# Patient Record
Sex: Female | Born: 1970 | Race: White | Hispanic: No | Smoking: Former smoker
Health system: Southern US, Community
[De-identification: ages and names within clinical notes are randomized; demographics above are authoritative.]

## PROBLEM LIST (undated history)

## (undated) DIAGNOSIS — N200 Calculus of kidney: Secondary | ICD-10-CM

## (undated) DIAGNOSIS — K859 Acute pancreatitis without necrosis or infection, unspecified: Secondary | ICD-10-CM

## (undated) DIAGNOSIS — E119 Type 2 diabetes mellitus without complications: Secondary | ICD-10-CM

## (undated) HISTORY — PX: RENAL BIOPSY: SHX156

## (undated) HISTORY — PX: OTHER SURGICAL HISTORY: SHX169

---

## 2009-05-03 ENCOUNTER — Ambulatory Visit: Payer: Self-pay

## 2009-05-24 ENCOUNTER — Encounter: Payer: Self-pay | Admitting: Maternal & Fetal Medicine

## 2010-02-05 IMAGING — US US OB < 14 WKS - NRPT
1 series · 14 of 28 positions shown · non-contrast
Comparison: none

[Series 1: us ob < 14 wks - nrpt · 14 of 47 slices shown]
[im 2/47]
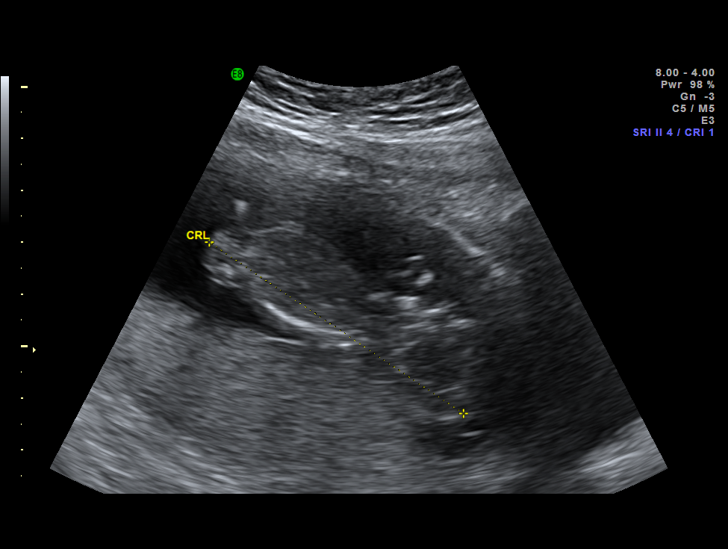
[im 6/47]
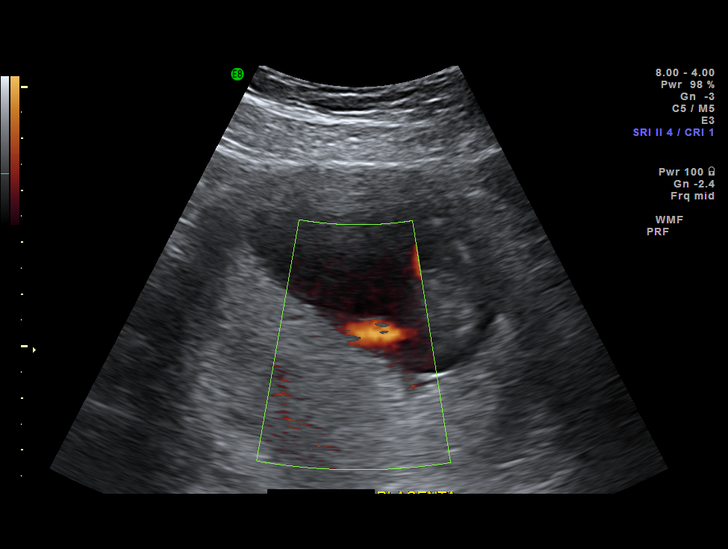
[im 9/47]
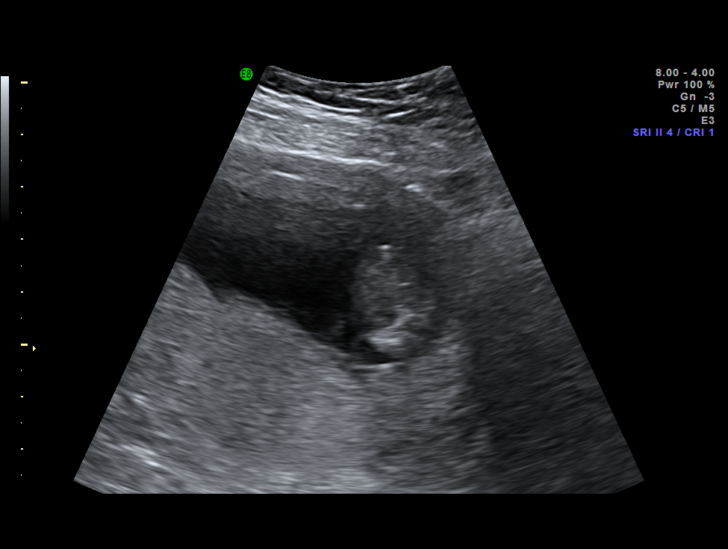
[im 12/47]
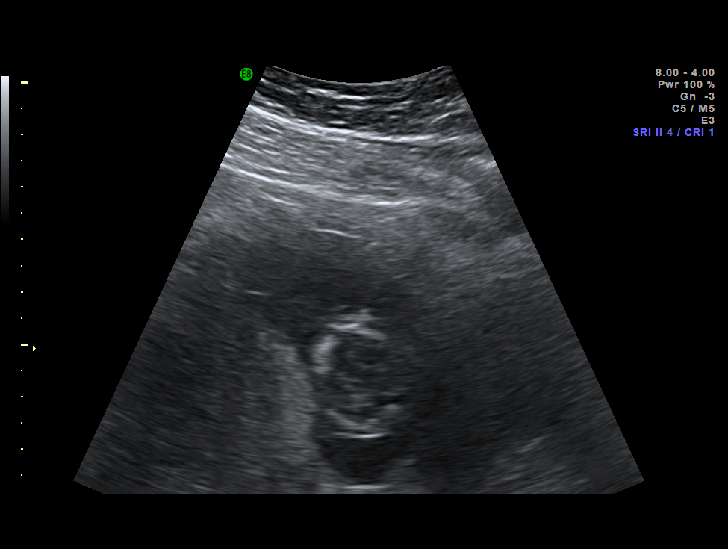
[im 16/47]
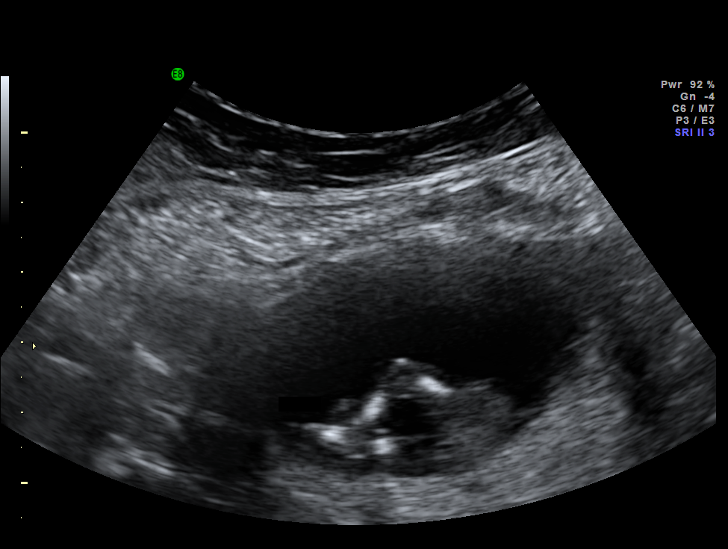
[im 19/47]
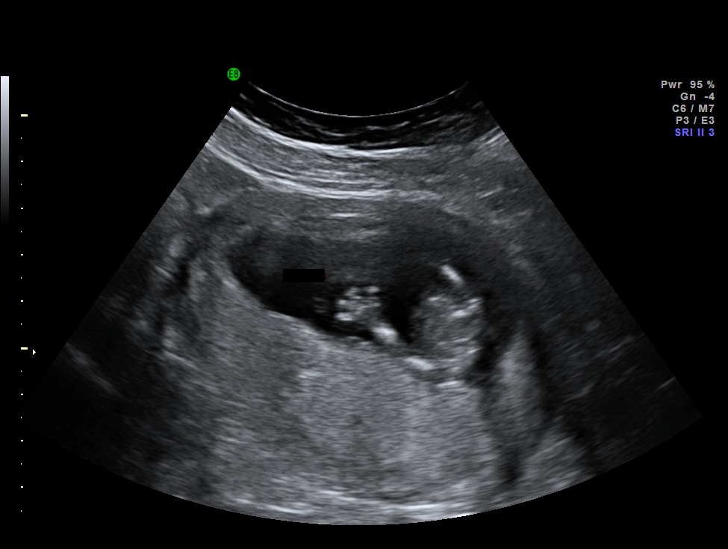
[im 23/47]
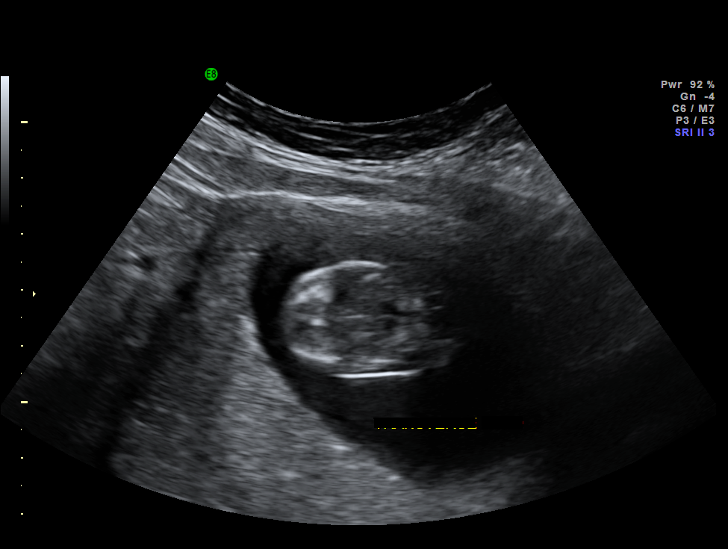
[im 26/47]
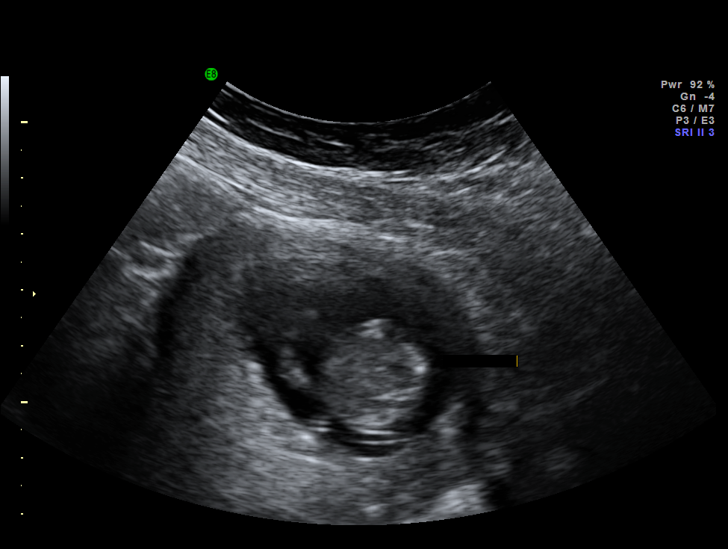
[im 29/47]
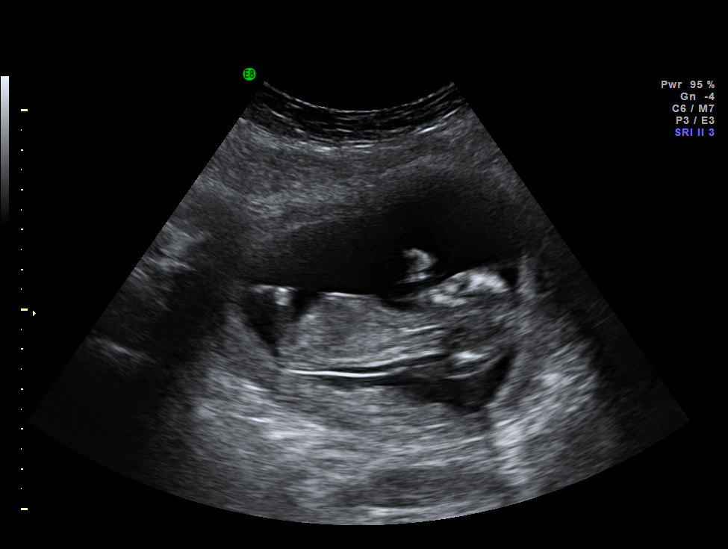
[im 33/47]
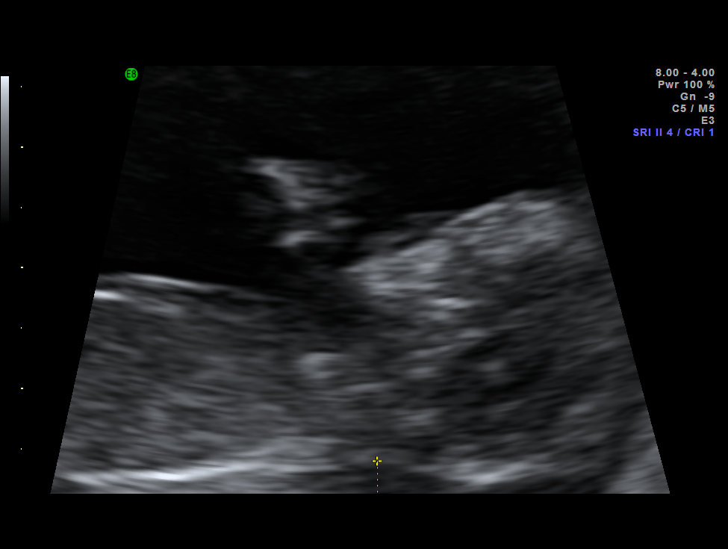
[im 36/47]
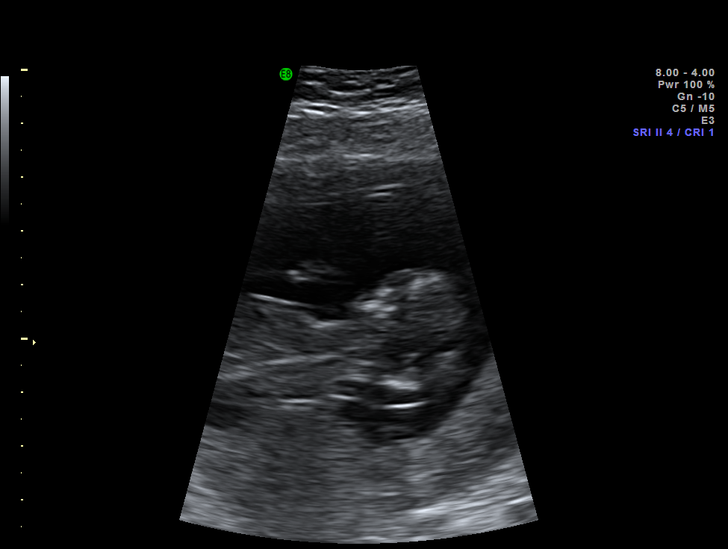
[im 40/47]
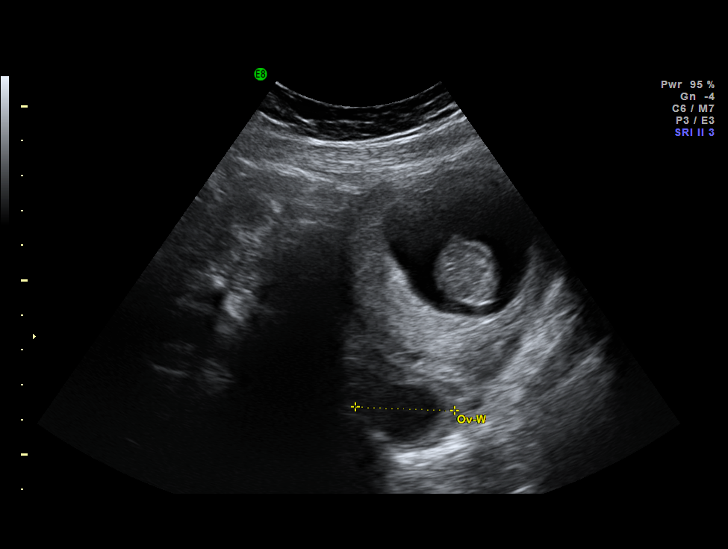
[im 43/47]
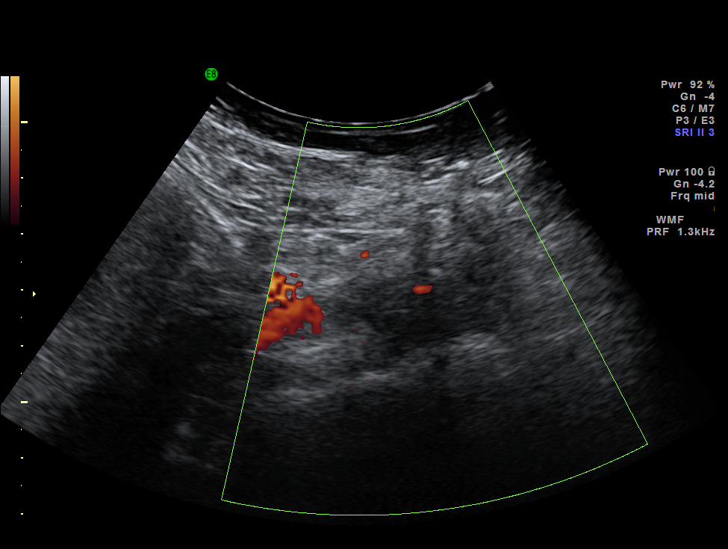
[im 47/47]
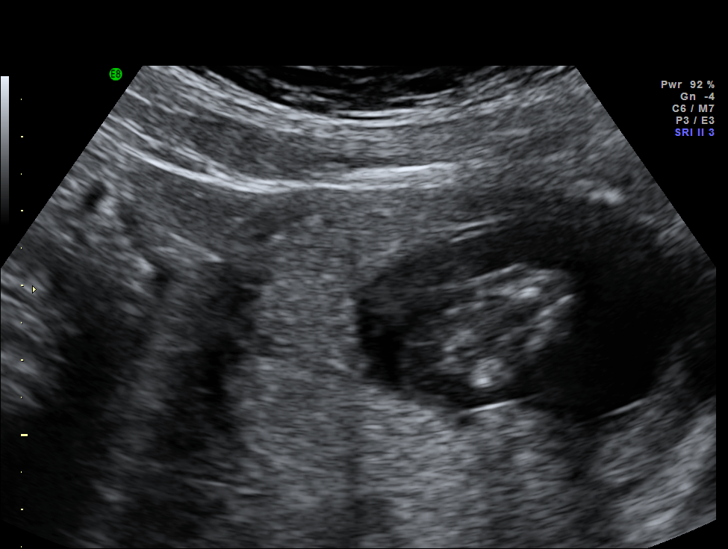

[14 of 28 positions shown; findings below may reference images not displayed]

IMAGES IMPORTED FROM THE [employer]NGO WORKFLOW SYSTEM
NO DICTATION FOR STUDY

## 2010-04-05 ENCOUNTER — Emergency Department: Payer: Self-pay | Admitting: Emergency Medicine

## 2010-04-10 ENCOUNTER — Ambulatory Visit: Payer: Self-pay | Admitting: Pain Medicine

## 2010-04-16 ENCOUNTER — Ambulatory Visit: Payer: Self-pay | Admitting: Pain Medicine

## 2010-07-25 ENCOUNTER — Ambulatory Visit: Payer: Self-pay | Admitting: Pain Medicine

## 2012-11-29 ENCOUNTER — Ambulatory Visit: Payer: Self-pay | Admitting: Urology

## 2013-02-03 ENCOUNTER — Ambulatory Visit: Payer: Self-pay | Admitting: Obstetrics and Gynecology

## 2013-02-03 LAB — HEMOGLOBIN: HGB: 15.1 g/dL (ref 12.0–16.0)

## 2013-02-10 ENCOUNTER — Ambulatory Visit: Payer: Self-pay | Admitting: Obstetrics and Gynecology

## 2014-01-25 ENCOUNTER — Ambulatory Visit: Payer: Self-pay | Admitting: Physician Assistant

## 2014-01-25 LAB — HCG, QUANTITATIVE, PREGNANCY: Beta Hcg, Quant.: <1 m[IU]/mL — ABNORMAL LOW

## 2015-03-09 NOTE — Op Note (Signed)
PATIENT NAME:  Denise [name] MR#:  [id number] DATE OF BIRTH:  1971-04-20  DATE OF PROCEDURE:  02/10/2013  PREOPERATIVE DIAGNOSES:  1.  Chronic pelvic pain.  2.  Right ovarian cyst. 3.  Menorrhagia.   POSTOPERATIVE DIAGNOSES: 1.  Chronic pelvic pain.  2.  Menorrhagia.  3.  Right ovarian cyst, left ovary normal.   OPERATION PERFORMED:  1.  Operative laparoscopy with right ovarian cystectomy. 2.  Hysteroscopy.  3.  Dilatation and curettage and NovaSure endometrial ablation.  ANESTHESIA USED:  General.   SURGEON:  Stoney Bang. Georgianne Fick, MD  ESTIMATED BLOOD LOSS:  Minimal.  OPERATIVE FLUIDS:  800 mL.   COMPLICATIONS:  None.   INTRAOPERATIVE FINDINGS:  Normal intra-abdominal findings with a simple-appearing 4 cm right ovarian cyst. The left ovary was normal. The right ovarian cyst was incised and the cyst wall was removed completely. The hysteroscopy revealed normal endometrial cavity and endometrial findings. The sounding length of the uterus was 8.5 cm, cervical length was determined to be 4.5 cm for a cavity length of 4 cm. The width was determined to be 2.4 cm. The power was 55 with a burn time of 1 minute 10 seconds. Good coverage of the endometrial cavity was noted post-ablation hysteroscopy.   SPECIMENS REMOVED:  Right ovarian cyst wall and endometrial curettings.  PATIENT CONDITION FOLLOWING THIS PROCEDURE:  Stable.   PROCEDURE IN DETAIL:  The risks, benefits, and alternatives of this procedure were discussed with the patient prior to proceeding to the Operating Room. The patient was taken to the Operating Room where she was placed under general endotracheal anesthesia. She was positioned in the dorsal lithotomy position using Allen stirrups. The patient was then prepped and draped in the usual sterile fashion. A time-out was performed. The umbilicus was incised at the base with a stab incision using the 11-blade scalpel. A 5 mm XL trocar was then used to gain entry into the  peritoneal cavity under direct visualization. Pneumoperitoneum was established. The left and right 5 mm assistant ports were then placed under direct visualization. Intraoperative findings wee as discussed above. The right ovarian cyst was incised using a 5 mm Harmonic. The cyst wall was then peeled out using a blunt grasper. The cyst was inspected and noted to be hemostatic. The pneumoperitoneum was evacuated and the ports were removed. Each 5 mm port site was then dressed with Dermabond. Attention was then turned to the patient's pelvis. A sterile speculum was placed. The anterior lip of the cervix was grasped with a single-tooth tenaculum and the cervix was serially dilated using Pratt dilators. Hysteroscopy was then performed noting the above findings. The uterus was sounded and the cervical length determined and these were dilated to the NovaSure device. The NovaSure device revealed a cavity width of 2.4 cm. The cavity assessment completed successfully and the burn time on the NovaSure device was 1 minute 10 seconds. Following the NovaSure ablation, hysteroscopy was once again performed noting good coverage of the endometrial cavity. Of note, a D and C was performed prior to the NovaSure ablation yielding a minimal amount of tissue. The tenaculum and the operative speculum were then removed. the sponge, needle, and instrument counts were correct x 2. The patient tolerated the procedure well and was taken to the Recovery Room in stable.   ____________________________ Stoney Bang. Georgianne Fick, MD ams:jm D: 02/12/2013 11:38:09 ET T: 02/12/2013 13:08:43 ET JOB#: [id number]  cc: Stoney Bang. Georgianne Fick, MD, <Dictator> Dorthula Nettles MD ELECTRONICALLY SIGNED 02/15/2013 13:18

## 2015-10-08 ENCOUNTER — Other Ambulatory Visit: Payer: Self-pay | Admitting: Family Medicine

## 2015-10-08 DIAGNOSIS — J209 Acute bronchitis, unspecified: Secondary | ICD-10-CM

## 2015-10-09 ENCOUNTER — Ambulatory Visit
Admission: RE | Admit: 2015-10-09 | Discharge: 2015-10-09 | Disposition: A | Discharge status: Home / Self Care | Payer: 59 | Source: Ambulatory Visit | Attending: Family Medicine | Admitting: Family Medicine

## 2015-10-09 DIAGNOSIS — J209 Acute bronchitis, unspecified: Secondary | ICD-10-CM | POA: Diagnosis present

## 2015-10-09 DIAGNOSIS — J189 Pneumonia, unspecified organism: Secondary | ICD-10-CM | POA: Diagnosis not present

## 2016-10-17 ENCOUNTER — Other Ambulatory Visit: Payer: Self-pay | Admitting: Family Medicine

## 2016-10-17 DIAGNOSIS — Z1231 Encounter for screening mammogram for malignant neoplasm of breast: Secondary | ICD-10-CM

## 2016-11-19 ENCOUNTER — Other Ambulatory Visit: Payer: Self-pay | Admitting: Surgery

## 2016-11-19 DIAGNOSIS — M25511 Pain in right shoulder: Secondary | ICD-10-CM

## 2016-11-28 ENCOUNTER — Other Ambulatory Visit: Payer: Self-pay | Admitting: Surgery

## 2016-11-28 ENCOUNTER — Ambulatory Visit
Admission: RE | Admit: 2016-11-28 | Discharge: 2016-11-28 | Disposition: A | Discharge status: Home / Self Care | Payer: BLUE CROSS/BLUE SHIELD | Source: Ambulatory Visit | Attending: Family Medicine | Admitting: Family Medicine

## 2016-11-28 DIAGNOSIS — S46911D Strain of unspecified muscle, fascia and tendon at shoulder and upper arm level, right arm, subsequent encounter: Secondary | ICD-10-CM

## 2016-11-28 DIAGNOSIS — M7581 Other shoulder lesions, right shoulder: Secondary | ICD-10-CM

## 2016-11-28 DIAGNOSIS — Z1231 Encounter for screening mammogram for malignant neoplasm of breast: Secondary | ICD-10-CM | POA: Diagnosis present

## 2016-12-04 ENCOUNTER — Ambulatory Visit
Admission: RE | Admit: 2016-12-04 | Discharge: 2016-12-04 | Disposition: A | Discharge status: Home / Self Care | Payer: BLUE CROSS/BLUE SHIELD | Source: Ambulatory Visit | Attending: Surgery | Admitting: Surgery

## 2017-07-01 ENCOUNTER — Other Ambulatory Visit: Payer: Self-pay | Admitting: Internal Medicine

## 2017-07-01 DIAGNOSIS — R1011 Right upper quadrant pain: Secondary | ICD-10-CM

## 2017-07-02 ENCOUNTER — Ambulatory Visit
Admission: RE | Admit: 2017-07-02 | Discharge: 2017-07-02 | Disposition: A | Discharge status: Home / Self Care | Payer: BLUE CROSS/BLUE SHIELD | Source: Ambulatory Visit | Attending: Internal Medicine | Admitting: Internal Medicine

## 2017-07-02 DIAGNOSIS — R1011 Right upper quadrant pain: Secondary | ICD-10-CM | POA: Insufficient documentation

## 2017-07-02 DIAGNOSIS — K769 Liver disease, unspecified: Secondary | ICD-10-CM | POA: Insufficient documentation

## 2017-07-02 DIAGNOSIS — K838 Other specified diseases of biliary tract: Secondary | ICD-10-CM | POA: Diagnosis not present

## 2017-11-02 ENCOUNTER — Encounter: Payer: Self-pay | Admitting: Emergency Medicine

## 2017-11-02 ENCOUNTER — Emergency Department: Payer: BLUE CROSS/BLUE SHIELD

## 2017-11-02 ENCOUNTER — Emergency Department
Admission: EM | Admit: 2017-11-02 | Discharge: 2017-11-02 | Disposition: A | Discharge status: Home / Self Care | Payer: BLUE CROSS/BLUE SHIELD | Attending: Emergency Medicine | Admitting: Emergency Medicine

## 2017-11-02 ENCOUNTER — Other Ambulatory Visit: Payer: Self-pay

## 2017-11-02 DIAGNOSIS — F17210 Nicotine dependence, cigarettes, uncomplicated: Secondary | ICD-10-CM | POA: Diagnosis not present

## 2017-11-02 DIAGNOSIS — R1032 Left lower quadrant pain: Secondary | ICD-10-CM | POA: Diagnosis not present

## 2017-11-02 DIAGNOSIS — K869 Disease of pancreas, unspecified: Secondary | ICD-10-CM | POA: Diagnosis not present

## 2017-11-02 DIAGNOSIS — R112 Nausea with vomiting, unspecified: Secondary | ICD-10-CM

## 2017-11-02 DIAGNOSIS — Z79899 Other long term (current) drug therapy: Secondary | ICD-10-CM | POA: Insufficient documentation

## 2017-11-02 DIAGNOSIS — Q453 Other congenital malformations of pancreas and pancreatic duct: Secondary | ICD-10-CM

## 2017-11-02 DIAGNOSIS — R1013 Epigastric pain: Secondary | ICD-10-CM | POA: Diagnosis present

## 2017-11-02 HISTORY — DX: Acute pancreatitis without necrosis or infection, unspecified: K85.90

## 2017-11-02 HISTORY — DX: Calculus of kidney: N20.0

## 2017-11-02 LAB — COMPREHENSIVE METABOLIC PANEL
ALT: 20 U/L (ref 14–54)
AST: 35 U/L (ref 15–41)
Albumin: 4.4 g/dL (ref 3.5–5.0)
Alkaline Phosphatase: 92 U/L (ref 38–126)
Anion gap: 12 (ref 5–15)
BUN: 9 mg/dL (ref 6–20)
CHLORIDE: 102 mmol/L (ref 101–111)
CO2: 24 mmol/L (ref 22–32)
Calcium: 9.4 mg/dL (ref 8.9–10.3)
Creatinine, Ser: 0.72 mg/dL (ref 0.44–1.00)
Glucose, Bld: 108 mg/dL — ABNORMAL HIGH (ref 65–99)
POTASSIUM: 4 mmol/L (ref 3.5–5.1)
Sodium: 138 mmol/L (ref 135–145)
Total Bilirubin: 0.9 mg/dL (ref 0.3–1.2)
Total Protein: 7.8 g/dL (ref 6.5–8.1)

## 2017-11-02 LAB — CBC
HEMATOCRIT: 45.5 % (ref 35.0–47.0)
HEMOGLOBIN: 15.4 g/dL (ref 12.0–16.0)
MCH: 32.2 pg (ref 26.0–34.0)
MCHC: 33.8 g/dL (ref 32.0–36.0)
MCV: 95.3 fL (ref 80.0–100.0)
Platelets: 355 10*3/uL (ref 150–440)
RBC: 4.77 MIL/uL (ref 3.80–5.20)
RDW: 14.3 % (ref 11.5–14.5)
WBC: 7 10*3/uL (ref 3.6–11.0)

## 2017-11-02 LAB — LIPASE, BLOOD: LIPASE: 17 U/L (ref 11–51)

## 2017-11-02 LAB — TROPONIN I: Troponin I: <0.03 ng/mL (ref <0.03)

## 2017-11-02 MED ORDER — OXYCODONE-ACETAMINOPHEN 5-325 MG PO TABS: 1.0000 | ORAL_TABLET | ORAL | 0 refills | Status: AC | PRN

## 2017-11-02 MED ORDER — GADOBENATE DIMEGLUMINE 529 MG/ML IV SOLN
15.0000 mL | Freq: Once | INTRAVENOUS | Status: AC | PRN
  Administered 2017-11-02: 14 mL via INTRAVENOUS

## 2017-11-02 MED ORDER — ONDANSETRON 4 MG PO TBDP: 4.0000 mg | ORAL_TABLET | Freq: Three times a day (TID) | ORAL | 0 refills | Status: AC | PRN

## 2017-11-02 MED ORDER — IOPAMIDOL (ISOVUE-300) INJECTION 61%
100.0000 mL | Freq: Once | INTRAVENOUS | Status: AC | PRN
  Administered 2017-11-02: 100 mL via INTRAVENOUS

## 2017-11-02 MED ORDER — HYDROMORPHONE HCL 1 MG/ML IJ SOLN
1.0000 mg | Freq: Once | INTRAMUSCULAR | Status: AC
  Administered 2017-11-02: 1 mg via INTRAVENOUS
  Filled 2017-11-02: qty 1

## 2017-11-02 MED ORDER — OXYCODONE-ACETAMINOPHEN 5-325 MG PO TABS
1.0000 | ORAL_TABLET | Freq: Once | ORAL | Status: AC
Start: 2017-11-02 — End: 2017-11-02
  Administered 2017-11-02: 1 via ORAL
  Filled 2017-11-02: qty 1

## 2017-11-02 MED ORDER — ONDANSETRON HCL 4 MG/2ML IJ SOLN
4.0000 mg | Freq: Once | INTRAMUSCULAR | Status: AC
  Administered 2017-11-02: 4 mg via INTRAVENOUS
  Filled 2017-11-02: qty 2

## 2017-11-02 MED ORDER — SODIUM CHLORIDE 0.9 % IV BOLUS (SEPSIS)
1000.0000 mL | Freq: Once | INTRAVENOUS | Status: AC
  Administered 2017-11-02: 1000 mL via INTRAVENOUS

## 2017-11-02 MED ORDER — SENNOSIDES-DOCUSATE SODIUM 8.6-50 MG PO TABS: 2.0000 | ORAL_TABLET | Freq: Every day | ORAL | 1 refills | Status: AC | PRN

## 2017-11-02 NOTE — ED Notes (Signed)
Pt presents today with abdominal pain that started 4 days ago. Pt states she has a GI appointment on the 20th. Pt states she has pain in the lower quad. Pt is A/o x4 and is NAD. Awaiting EDP

## 2017-11-02 NOTE — Discharge Instructions (Signed)
Please take a clear liquid diet for the next 48 hours, then advance to bland diet as tolerated.  You may take Tylenol for mild to moderate pain, and Percocet for severe pain.  Do not drive within 8 hours of taking Percocet.  If you take Percocet, please take the stool softener to prevent constipation.  Zofran is for nausea or vomiting.  Drink plenty of fluids stay well-hydrated.  Please keep your appointment with with the GI physician on Thursday.  Please have your GI doctor follow-up the results of the MRI that you had in the emergency department.   Return to the emergency department if you develop severe pain, inability to keep down fluids, fever, or any other symptoms concerning to you.

## 2017-11-02 NOTE — ED Triage Notes (Signed)
L upper abd pain, nausea and vomiting x 4 days. History of pancreatitis.

## 2017-11-02 NOTE — ED Provider Notes (Signed)
[facility] Emergency Department Provider Note  ____________________________________________  Time seen: Approximately 3:06 PM  I have reviewed the triage vital signs and the nursing notes.   HISTORY  Chief Complaint Abdominal Pain    HPI Denise [name] is a 46 y.o. female with a recent admission to [facility] for pancreatitis presenting with epigastric and left lower quadrant pain, nausea and vomiting.  The patient reports that for the last 3 days, she has been unable to keep anything down.  She has a sharp pain in the left lower quadrant, but has pain in the epigastrium when she pushes on it.  She has had fever to 102.0 at home.  She denies any constipation or diarrhea; last bowel movement was today and it was normal.  No cough or cold symptoms.  Past Medical History:  Diagnosis Date  . Kidney stones   . Pancreatitis     There are no active problems to display for this patient.   Past Surgical History:  Procedure Laterality Date  . RENAL BIOPSY    . uterine ablation      Current Outpatient Rx  . Order #: [id number] Class: Historical Med  . Order #: [id number] Class: Historical Med  . Order #: [id number] Class: Historical Med  . Order #: [id number] Class: Historical Med  . Order #: [id number] Class: Print  . Order #: [id number] Class: Print  . Order #: [id number] Class: Print  . Order #: [id number] Class: Historical Med    Allergies Amoxicillin and Avelox [moxifloxacin]  Family History  Problem Relation Age of Onset  . Prostate cancer Father   . Breast cancer Paternal Aunt        11's  . Prostate cancer Paternal Uncle        4 pat unlces   . Brain cancer Paternal Grandmother     Social History Social History   Tobacco Use  . Smoking status: Current Every Day Smoker    Packs/day: 0.50    Types: Cigarettes  Substance Use Topics  . Alcohol use: Not on file  . Drug use: Not on file    Review of Systems Constitutional: Positive fever to 102.0.  No  myalgias. Eyes: No visual changes. ENT: No sore throat. No congestion or rhinorrhea. Cardiovascular: Denies chest pain. Denies palpitations. Respiratory: Denies shortness of breath.  No cough. Gastrointestinal: Positive epigastric and left lower quadrant abdominal pain.  Positive nausea, positive vomiting.  No diarrhea.  No constipation. Genitourinary: Negative for dysuria. Musculoskeletal: Negative for back pain. Skin: Negative for rash. Neurological: Negative for headaches. No focal numbness, tingling or weakness.     ____________________________________________   PHYSICAL EXAM:  VITAL SIGNS: ED Triage Vitals  Enc Vitals Group     BP 11/02/17 1220 (!) 164/104     Pulse Rate 11/02/17 1220 (!) 120     Resp 11/02/17 1220 20     Temp 11/02/17 1220 99 F (37.2 C)     Temp Source 11/02/17 1220 Oral     SpO2 11/02/17 1220 96 %     Weight 11/02/17 1222 158 lb (71.7 kg)     Height 11/02/17 1222 5\' 3"  (1.6 m)     Head Circumference --      Peak Flow --      Pain Score 11/02/17 1220 9     Pain Loc --      Pain Edu? --      Excl. in [place]? --     Constitutional: Alert and oriented.  Mildly uncomfortable appearing but nontoxic. Answers  questions appropriately. Eyes: Conjunctivae are normal.  EOMI. No scleral icterus. Head: Atraumatic. Nose: No congestion/rhinnorhea. Mouth/Throat: Mucous membranes are moist.  Neck: No stridor.  Supple.  No JVD.  No meningismus. Cardiovascular: Past rate, regular rhythm. No murmurs, rubs or gallops.  Respiratory: Normal respiratory effort.  No accessory muscle use or retractions. Lungs CTAB.  No wheezes, rales or ronchi. Gastrointestinal: Soft, and nondistended.  Tender to palpation in the epigastrium in the left lower quadrant.  Negative Murphy sign.  No guarding or rebound.  No peritoneal signs. Musculoskeletal: No LE edema. Neurologic:  A&Ox3.  Speech is clear.  Face and smile are symmetric.  EOMI.  Moves all extremities well. Skin:  Skin is warm,  dry and intact. No rash noted. Psychiatric: Mood and affect are normal. Speech and behavior are normal.  Normal judgement.  ____________________________________________   LABS (all labs ordered are listed, but only abnormal results are displayed)  Labs Reviewed  COMPREHENSIVE METABOLIC PANEL - Abnormal; Notable for the following components:      Result Value   Glucose, Bld 108 (*)    All other components within normal limits  LIPASE, BLOOD  CBC  TROPONIN I  URINALYSIS, COMPLETE (UACMP) WITH MICROSCOPIC  POC URINE PREG, ED   ____________________________________________  EKG  ED ECG REPORT I, Eula Listen, the attending physician, personally viewed and interpreted this ECG.   Date: 11/02/2017  EKG Time: 1230  Rate: 111  Rhythm: sinus tachycardia  Axis: normal  Intervals:none  ST&T Change: No STEMI  ____________________________________________  RADIOLOGY  Ct Abdomen Pelvis W Contrast  Result Date: 11/02/2017 CLINICAL DATA:  Periumbilical pain for 4 days with nausea and vomiting and also fever. Recent episode of pancreatitis in October 2018. Abdominal distention. EXAM: CT ABDOMEN AND PELVIS WITH CONTRAST TECHNIQUE: Multidetector CT imaging of the abdomen and pelvis was performed using the standard protocol following bolus administration of intravenous contrast. CONTRAST:  132mL ISOVUE-300 IOPAMIDOL (ISOVUE-300) INJECTION 61% COMPARISON:  Overlapping portions of CT chest from 10/09/2015. Report from CT abdomen dated 08/24/2002 FINDINGS: Lower chest: Unremarkable Hepatobiliary: A 2.7 by 2.2 cm hypodense lesion with peripheral nodular enhancement is observed in segment 2 of the liver on image 13/2. Back on 10/09/2015, a hypodense lesion in this vicinity measuring 3.2 by 2.3 cm was present. On the delayed images there is some peripheral fill in of contrast although no complete enhancement of the lesion. On ultrasound a lesion has diffuse echogenicity. The lack of growth over  the last 2 years as well as the hyperechoic appearance and pattern of enhancement all favor benign hepatic hemangioma. The common hepatic duct measures 8 mm in diameter and the common bile duct measures up to 7 mm in diameter. No directly visualized filling defect or specific cause for this mild biliary dilatation is observed. Pancreas: On the prior chest CT the pancreas appeared hypodense and slightly atrophic. On today's CT that remains true except in the pancreatic tail where there is a 4.3 by 2.8 by 2.5 cm region which appears more prominent. This could be from inflammation or neoplasm. Spleen: Unremarkable Adrenals/Urinary Tract: Unremarkable Stomach/Bowel: Unremarkable Vascular/Lymphatic: Aortoiliac atherosclerotic vascular disease. No pathologic adenopathy identified. Reproductive: Unremarkable Other: No supplemental non-categorized findings. Musculoskeletal: 8 mm sclerotic lesion in the L1 vertebral body not appreciably changed from 10/09/2015. Mild lower lumbar spondylosis and degenerative disc disease with borderline impingement at the L4-5 level. IMPRESSION: 1. Chronic atrophic appearance of the pancreas with abnormal expansion and abnormal increased density in the pancreatic tail which could conceivably be  a strange postinflammatory appearance, but could also be neoplastic such as endocrine or exocrine malignancy of the pancreas. This appearance of the pancreatic tail is a significant change from 10/09/2015. Pancreatic protocol MRI with and without contrast is recommended for definitive imaging characterization and to help determine whether this needs biopsy. 2. The lesion in segment 2 of the liver is highly likely to represent a benign hepatic hemangioma based on the echogenic appearance, lack of growth from 2016, and progressive nodular marginal enhancement. 3.  Aortic Atherosclerosis (ICD10-I70.0). Electronically Signed   By: Van Clines M.D.   On: 11/02/2017 16:42     ____________________________________________   PROCEDURES  Procedure(s) performed: None  Procedures  Critical Care performed: No ____________________________________________   INITIAL IMPRESSION / ASSESSMENT AND PLAN / ED COURSE  Pertinent labs & imaging results that were available during my care of the patient were reviewed by me and considered in my medical decision making (see chart for details).  46 y.o. female without any history of abdominal surgery, recent admission for pancreatitis, presenting with left lower quadrant and epigastric pain, associated with nausea and vomiting.  Overall, the patient is mildly tachycardic which may be from some pain and mild dehydration, but afebrile.  However, she has had fevers at home.  I will plan to get a CT scan to evaluate for pancreatitis, gallbladder pathology, and diverticulitis.  The patient will be treated symptomatically, and given intravenous fluids.  Plan reevaluation for final disposition.  I reviewed the patient's medical chart.  ----------------------------------------- 7:07 PM on 11/02/2017 -----------------------------------------  The patient has a negative lipase and a normal white blood cell count without a fever.  Her CT scan shows a chronic atrophic pancreas with abnormal expansion and abnormal increased density in the pancreatic tail.  Given that the patient is clinically stable, her pain has resolved, and she is no longer vomiting, if this pancreatic abnormality can have an outpatient workup.  In order to facilitate her outpatient workup, I have ordered an MRI to be performed in the emergency department and followed up with her GI physician on Thursday.  The patient understands that she will need to obtain the results of the study, and that either her primary care physician or her GI doctor will give her outpatient follow-up recommendations.  She understands return precautions as well as follow-up instructions and will  be discharged after the MRI but does not need to wait for the results today.     ____________________________________________  FINAL CLINICAL IMPRESSION(S) / ED DIAGNOSES  Final diagnoses:  Pancreatic abnormality  Left lower quadrant pain  Non-intractable vomiting with nausea, unspecified vomiting type         NEW MEDICATIONS STARTED DURING THIS VISIT:  This SmartLink is deprecated. Use AVSMEDLIST instead to display the medication list for a patient.    Eula Listen, MD 11/02/17 1910

## 2017-11-05 ENCOUNTER — Other Ambulatory Visit: Payer: Self-pay | Admitting: Internal Medicine

## 2017-11-05 DIAGNOSIS — R1013 Epigastric pain: Secondary | ICD-10-CM

## 2017-11-05 DIAGNOSIS — R112 Nausea with vomiting, unspecified: Secondary | ICD-10-CM

## 2017-11-06 ENCOUNTER — Telehealth: Payer: Self-pay

## 2017-11-06 NOTE — Telephone Encounter (Signed)
After further review of chart, noted a pancreatic tail mass for evaluation. Sent referral to [facility] at patient request for scheduling. They will call her with date/time/instructions. Oncology Nurse Navigator Documentation  Navigator Location: [facility] (11/06/17 1559)   )Navigator Encounter Type: Telephone (11/06/17 1559) Telephone: Outgoing Call (11/06/17 1559)                               Coordination of Care: EUS (11/06/17 1559)                  Time Spent with Patient: 15 (11/06/17 1559)

## 2017-11-06 NOTE — Telephone Encounter (Signed)
Received referral for EUS from Dr. Alice Reichert. Called Denise [name] and offered next available at [facility], 1/24. She would like EUS performed sooner, I will notify Dr. Nestor Lewandowsky office Monday ,they are closed at present. Oncology Nurse Navigator Documentation  Navigator Location: [facility] (11/06/17 1500)   )Navigator Encounter Type: Telephone (11/06/17 1500) Telephone: [name] Call (11/06/17 1500)                       Barriers/Navigation Needs: Coordination of Care (11/06/17 1500)   Interventions: Coordination of Care (11/06/17 1500)   Coordination of Care: EUS (11/06/17 1500)                  Time Spent with Patient: 15 (11/06/17 1500)

## 2017-11-09 ENCOUNTER — Encounter
Admission: RE | Admit: 2017-11-09 | Discharge: 2017-11-09 | Disposition: A | Discharge status: Home / Self Care | Payer: BLUE CROSS/BLUE SHIELD | Source: Ambulatory Visit | Attending: Internal Medicine | Admitting: Internal Medicine

## 2017-11-09 DIAGNOSIS — R112 Nausea with vomiting, unspecified: Secondary | ICD-10-CM | POA: Insufficient documentation

## 2017-11-09 DIAGNOSIS — R1013 Epigastric pain: Secondary | ICD-10-CM | POA: Insufficient documentation

## 2017-11-09 MED ORDER — TECHNETIUM TC 99M MEBROFENIN IV KIT
5.0520 | PACK | Freq: Once | INTRAVENOUS | Status: AC | PRN
  Administered 2017-11-09: 5.052 via INTRAVENOUS

## 2017-12-01 ENCOUNTER — Other Ambulatory Visit: Payer: Self-pay | Admitting: Internal Medicine

## 2017-12-01 DIAGNOSIS — Q453 Other congenital malformations of pancreas and pancreatic duct: Secondary | ICD-10-CM

## 2017-12-01 DIAGNOSIS — R10816 Epigastric abdominal tenderness: Secondary | ICD-10-CM

## 2017-12-11 ENCOUNTER — Ambulatory Visit
Admission: RE | Admit: 2017-12-11 | Discharge: 2017-12-11 | Disposition: A | Discharge status: Home / Self Care | Payer: BLUE CROSS/BLUE SHIELD | Source: Ambulatory Visit | Attending: Internal Medicine | Admitting: Internal Medicine

## 2017-12-11 DIAGNOSIS — R1012 Left upper quadrant pain: Secondary | ICD-10-CM | POA: Diagnosis not present

## 2017-12-11 DIAGNOSIS — K862 Cyst of pancreas: Secondary | ICD-10-CM | POA: Diagnosis not present

## 2017-12-11 DIAGNOSIS — D1803 Hemangioma of intra-abdominal structures: Secondary | ICD-10-CM | POA: Insufficient documentation

## 2017-12-11 DIAGNOSIS — R933 Abnormal findings on diagnostic imaging of other parts of digestive tract: Secondary | ICD-10-CM | POA: Diagnosis not present

## 2017-12-11 DIAGNOSIS — Q453 Other congenital malformations of pancreas and pancreatic duct: Secondary | ICD-10-CM | POA: Insufficient documentation

## 2017-12-11 DIAGNOSIS — R10816 Epigastric abdominal tenderness: Secondary | ICD-10-CM | POA: Insufficient documentation

## 2017-12-11 MED ORDER — IOPAMIDOL (ISOVUE-370) INJECTION 76%
75.0000 mL | Freq: Once | INTRAVENOUS | Status: AC | PRN
Start: 2017-12-11 — End: 2017-12-11
  Administered 2017-12-11: 75 mL via INTRAVENOUS

## 2019-03-06 ENCOUNTER — Emergency Department: Payer: BLUE CROSS/BLUE SHIELD

## 2019-03-06 ENCOUNTER — Encounter: Payer: Self-pay | Admitting: Emergency Medicine

## 2019-03-06 ENCOUNTER — Other Ambulatory Visit: Payer: Self-pay

## 2019-03-06 ENCOUNTER — Emergency Department
Admission: EM | Admit: 2019-03-06 | Discharge: 2019-03-07 | Disposition: A | Discharge status: Home / Self Care | Payer: BLUE CROSS/BLUE SHIELD | Attending: Emergency Medicine | Admitting: Emergency Medicine

## 2019-03-06 DIAGNOSIS — M79601 Pain in right arm: Secondary | ICD-10-CM | POA: Diagnosis not present

## 2019-03-06 DIAGNOSIS — M779 Enthesopathy, unspecified: Secondary | ICD-10-CM | POA: Insufficient documentation

## 2019-03-06 DIAGNOSIS — Z87891 Personal history of nicotine dependence: Secondary | ICD-10-CM | POA: Diagnosis not present

## 2019-03-06 DIAGNOSIS — M7989 Other specified soft tissue disorders: Secondary | ICD-10-CM

## 2019-03-06 NOTE — ED Notes (Signed)
Pt transported to US at this time. 

## 2019-03-06 NOTE — ED Provider Notes (Signed)
[facility] Emergency Department Provider Note   ____________________________________________   First MD Initiated Contact with Patient 03/06/19 2350     (approximate)  I have reviewed the triage vital signs and the nursing notes.   HISTORY  Chief Complaint Arm Pain    HPI Denise [name] is a 48 y.o. female who presents to the ED from home with a chief complaint of nontraumatic right forearm pain and swelling.  Patient reports a 1 week history of right forearm swelling with progressive pain for the past week.  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  She is right-hand dominant.  Recently treated with Z-Pak for bronchitis.  Tested COVID negative.  Otherwise denies fever, chest pain, shortness of breath, abdominal pain, nausea or vomiting.  Denies recent travel or hormone use.       Past Medical History:  Diagnosis Date  . Kidney stones   . Pancreatitis     There are no active problems to display for this patient.   Past Surgical History:  Procedure Laterality Date  . RENAL BIOPSY    . uterine ablation      Prior to Admission medications   Medication Sig Start Date End Date Taking? Authorizing Provider  ADVAIR DISKUS 250-50 MCG/DOSE AEPB Inhale 1 puff into the lungs every 12 (twelve) hours. 10/02/17   [provider]  fluticasone (FLONASE) 50 MCG/ACT nasal spray Place 2 sprays into the nose daily. 12/17/16 12/17/17  [provider]  metoprolol succinate (TOPROL-XL) 25 MG 24 hr tablet Take 1 tablet by mouth daily. 10/22/17   [provider]  naproxen (NAPROSYN) 500 MG tablet Take 1 tablet (500 mg total) by mouth 2 (two) times daily with a meal. 03/07/19   Paulette Blanch, MD  ondansetron (ZOFRAN ODT) 4 MG disintegrating tablet Take 1 tablet (4 mg total) by mouth every 8 (eight) hours as needed for nausea or vomiting. 11/02/17   Eula Listen, MD  oxyCODONE-acetaminophen  (PERCOCET/ROXICET) 5-325 MG tablet Take 1 tablet by mouth every 4 (four) hours as needed for severe pain. 03/07/19   Paulette Blanch, MD  traMADol (ULTRAM) 50 MG tablet Take 1 tablet by mouth every 8 (eight) hours as needed. 09/10/17   [provider]  zolpidem (AMBIEN) 10 MG tablet Take 1 tablet by mouth daily. 10/22/17   [provider]    Allergies Penicillins; Amoxicillin; and Avelox [moxifloxacin]  Family History  Problem Relation Age of Onset  . Prostate cancer Father   . Breast cancer Paternal Aunt        16's  . Prostate cancer Paternal Uncle        4 pat unlces   . Brain cancer Paternal Grandmother     Social History Social History   Tobacco Use  . Smoking status: Former Smoker    Packs/day: 0.50    Types: Cigarettes  . Smokeless tobacco: Never Used  Substance Use Topics  . Alcohol use: Not on file  . Drug use: Not on file    Review of Systems  Constitutional: No fever/chills Eyes: No visual changes. ENT: No sore throat. Cardiovascular: Denies chest pain. Respiratory: Denies shortness of breath. Gastrointestinal: No abdominal pain.  No nausea, no vomiting.  No diarrhea.  No constipation. Genitourinary: Negative for dysuria. Musculoskeletal: Positive for right forearm pain and swelling.  Negative for back pain. Skin: Negative for rash. Neurological: Negative for headaches, focal weakness or numbness.   ____________________________________________   PHYSICAL  EXAM:  VITAL SIGNS: ED Triage Vitals  Enc Vitals Group     BP 03/06/19 2225 (!) 161/81     Pulse Rate 03/06/19 2225 (!) 109     Resp 03/06/19 2225 18     Temp 03/06/19 2225 97.7 F (36.5 C)     Temp Source 03/06/19 2225 Oral     SpO2 03/06/19 2225 98 %     Weight 03/06/19 2226 170 lb (77.1 kg)     Height 03/06/19 2226 5\' 2"  (1.575 m)     Head Circumference --      Peak Flow --      Pain Score 03/06/19 2225 6     Pain Loc --      Pain Edu? --      Excl. in [place]? --      Constitutional: Alert and oriented. Well appearing and in no acute distress. Eyes: Conjunctivae are normal. PERRL. EOMI. Head: Atraumatic. Nose: No congestion/rhinnorhea. Mouth/Throat: Mucous membranes are moist.  Oropharynx non-erythematous. Neck: No stridor.   Cardiovascular: Normal rate, regular rhythm. Grossly normal heart sounds.  Good peripheral circulation. Respiratory: Normal respiratory effort.  No retractions. Lungs CTAB. Gastrointestinal: Soft and nontender. No distention. No abdominal bruits. No CVA tenderness. Musculoskeletal:  RUE: Right forearm tender to palpation.  Measures 26.5 cm compared to the left at 26 cm.  Right upper arm tender to palpation.  Measures 33 cm compared to left at 33.5 cm.  Symmetrically warm limbs.  2+ radial pulse.  Brisk, less than 5-second capillary refill. No lower extremity tenderness nor edema.  No joint effusions. Neurologic:  Normal speech and language. No gross focal neurologic deficits are appreciated. No gait instability. Skin:  Skin is warm, dry and intact. No rash noted. Psychiatric: Mood and affect are normal. Speech and behavior are normal.  ____________________________________________   LABS (all labs ordered are listed, but only abnormal results are displayed)  Labs Reviewed  CBC WITH DIFFERENTIAL/PLATELET - Abnormal; Notable for the following components:      Result Value   WBC 10.8 (*)    Platelets 610 (*)    Eosinophils Absolute 0.8 (*)    Basophils Absolute 0.2 (*)    All other components within normal limits  BASIC METABOLIC PANEL - Abnormal; Notable for the following components:   Glucose, Bld 113 (*)    All other components within normal limits  CK   ____________________________________________  EKG  None ____________________________________________  RADIOLOGY  ED MD interpretation: No DVT  Official radiology report(s): US Venous Img Upper Uni Right  Result Date: 03/07/2019 CLINICAL DATA:  Right upper  extremity swelling EXAM: RIGHT UPPER EXTREMITY VENOUS DOPPLER ULTRASOUND TECHNIQUE: Gray-scale sonography with graded compression, as well as color Doppler and duplex ultrasound were performed to evaluate the upper extremity deep venous system from the level of the subclavian vein and including the jugular, axillary, basilic, radial, ulnar and upper cephalic vein. Spectral Doppler was utilized to evaluate flow at rest and with distal augmentation maneuvers. COMPARISON:  None. FINDINGS: Contralateral Subclavian Vein: Respiratory phasicity is normal and symmetric with the symptomatic side. No evidence of thrombus. Normal compressibility. Internal Jugular Vein: No evidence of thrombus. Normal compressibility, respiratory phasicity and response to augmentation. Subclavian Vein: No evidence of thrombus. Normal compressibility, respiratory phasicity and response to augmentation. Axillary Vein: No evidence of thrombus. Normal compressibility, respiratory phasicity and response to augmentation. Cephalic Vein: No evidence of thrombus. Normal compressibility, respiratory phasicity and response to augmentation. Basilic Vein: No evidence of thrombus. Normal compressibility,  respiratory phasicity and response to augmentation. Brachial Veins: No evidence of thrombus. Normal compressibility, respiratory phasicity and response to augmentation. Radial Veins: No evidence of thrombus. Normal compressibility, respiratory phasicity and response to augmentation. Ulnar Veins: No evidence of thrombus. Normal compressibility, respiratory phasicity and response to augmentation. Venous Reflux:  None visualized. Other Findings:  None visualized. IMPRESSION: No evidence of DVT within the right upper extremity. Electronically Signed   By: Rolm Baptise M.D.   On: 03/07/2019 00:20    ____________________________________________   PROCEDURES  Procedure(s) performed (including Critical Care):  Procedures    ____________________________________________   INITIAL IMPRESSION / ASSESSMENT AND PLAN / ED COURSE  As part of my medical decision making, I reviewed the following data within the [place] notes reviewed and incorporated, Labs reviewed, Old chart reviewed and Notes from prior ED visits        48 year old female who presents with nontraumatic right forearm and upper arm pain.  Differential diagnosis includes but is not limited to musculoskeletal, inflammatory, infectious, DVT, etc.  Denise [name] was evaluated in Emergency Department on 03/07/2019 for the symptoms described in the history of present illness. She was evaluated in the context of the global COVID-19 pandemic, which necessitated consideration that the patient might be at risk for infection with the SARS-CoV-2 virus that causes COVID-19. Institutional protocols and algorithms that pertain to the evaluation of patients at risk for COVID-19 are in a state of rapid change based on information released by regulatory bodies including the CDC and federal and state organizations. These policies and algorithms were followed during the patient's care in the ED.  Awaiting results of ultrasound.  Will check basic lab work including CK.  Administer IV Toradol and oral Percocet and reassess.   Clinical Course as of Mar 06 158  Mon Mar 07, 2019  0155 Feeling better.  Updated her on all test results.  Will place in sling, prescription for NSAIDs, analgesia and work note.  Patient will follow-up with orthopedics as needed.  Strict return precautions given.  Patient verbalizes understanding agrees with plan of care.   [JS]    Clinical Course User Index [JS] Paulette Blanch, MD     ____________________________________________   FINAL CLINICAL IMPRESSION(S) / ED DIAGNOSES  Final diagnoses:  Right arm pain  Tendonitis     ED Discharge Orders         Ordered    naproxen (NAPROSYN) 500 MG tablet  2 times  daily with meals     03/07/19 0157    oxyCODONE-acetaminophen (PERCOCET/ROXICET) 5-325 MG tablet  Every 4 hours PRN     03/07/19 0157           Note:  This document was prepared using Dragon voice recognition software and may include unintentional dictation errors.   Paulette Blanch, MD 03/07/19 [phone]

## 2019-03-06 NOTE — ED Triage Notes (Signed)
Patient with complaint of right arm swelling times one week. Patient denies any injury. Patient with positive radial pulse.

## 2019-03-06 NOTE — ED Notes (Signed)
Pt to US.

## 2019-03-07 LAB — BASIC METABOLIC PANEL
Anion gap: 10 (ref 5–15)
BUN: 14 mg/dL (ref 6–20)
CO2: 24 mmol/L (ref 22–32)
Calcium: 9.3 mg/dL (ref 8.9–10.3)
Chloride: 106 mmol/L (ref 98–111)
Creatinine, Ser: 0.6 mg/dL (ref 0.44–1.00)
GFR calc Af Amer: >60 mL/min (ref >60)
GFR calc non Af Amer: >60 mL/min (ref >60)
Glucose, Bld: 113 mg/dL — ABNORMAL HIGH (ref 70–99)
Potassium: 3.7 mmol/L (ref 3.5–5.1)
Sodium: 140 mmol/L (ref 135–145)

## 2019-03-07 LAB — CBC WITH DIFFERENTIAL/PLATELET
Abs Immature Granulocytes: 0.02 10*3/uL (ref 0.00–0.07)
Basophils Absolute: 0.2 10*3/uL — ABNORMAL HIGH (ref 0.0–0.1)
Basophils Relative: 2 %
Eosinophils Absolute: 0.8 10*3/uL — ABNORMAL HIGH (ref 0.0–0.5)
Eosinophils Relative: 8 %
HCT: 39.1 % (ref 36.0–46.0)
Hemoglobin: 13.1 g/dL (ref 12.0–15.0)
Immature Granulocytes: 0 %
Lymphocytes Relative: 33 %
Lymphs Abs: 3.6 10*3/uL (ref 0.7–4.0)
MCH: 31.5 pg (ref 26.0–34.0)
MCHC: 33.5 g/dL (ref 30.0–36.0)
MCV: 94 fL (ref 80.0–100.0)
Monocytes Absolute: 0.9 10*3/uL (ref 0.1–1.0)
Monocytes Relative: 9 %
Neutro Abs: 5.3 10*3/uL (ref 1.7–7.7)
Neutrophils Relative %: 48 %
Platelets: 610 10*3/uL — ABNORMAL HIGH (ref 150–400)
RBC: 4.16 MIL/uL (ref 3.87–5.11)
RDW: 13.6 % (ref 11.5–15.5)
WBC: 10.8 10*3/uL — ABNORMAL HIGH (ref 4.0–10.5)
nRBC: 0 % (ref 0.0–0.2)

## 2019-03-07 LAB — CK: Total CK: 99 U/L (ref 38–234)

## 2019-03-07 MED ORDER — OXYCODONE-ACETAMINOPHEN 5-325 MG PO TABS
1.0000 | ORAL_TABLET | Freq: Once | ORAL | Status: AC
  Administered 2019-03-07: 01:00:00 1 via ORAL
  Filled 2019-03-07: qty 1

## 2019-03-07 MED ORDER — NAPROXEN 500 MG PO TABS: 500.0000 mg | ORAL_TABLET | Freq: Two times a day (BID) | ORAL | 0 refills | Status: AC

## 2019-03-07 MED ORDER — OXYCODONE-ACETAMINOPHEN 5-325 MG PO TABS: 1.0000 | ORAL_TABLET | ORAL | 0 refills | Status: AC | PRN

## 2019-03-07 MED ORDER — KETOROLAC TROMETHAMINE 30 MG/ML IJ SOLN
15.0000 mg | Freq: Once | INTRAMUSCULAR | Status: AC
  Administered 2019-03-07: 01:00:00 15 mg via INTRAVENOUS
  Filled 2019-03-07: qty 1

## 2019-03-07 NOTE — Discharge Instructions (Addendum)
1.  Take pain medicines as needed (Naprosyn/Percocet). 2.  Wear sling as needed for comfort. 3.  Apply ice to affected area several times daily. 4.  Return to the ER for worsening symptoms, persistent vomiting, difficulty breathing or other concerns.

## 2019-03-07 NOTE — ED Notes (Signed)
Pt back from [place]: Dr Beather Arbour straight in to see pt

## 2019-03-07 NOTE — ED Notes (Signed)
Pt reports right arm pain with some swelling x 1 week; denies injury; says swelling started on top of right forearm and since has also moved to bottom forearm area; denies joint pain to arm; tenderness on palpation to forearm and bicep area; no redness or warmth; full ROM; pain is a constant aching pain and pt says she just can't get comfortable; denies shortness of breath; no history of DVT

## 2019-06-15 ENCOUNTER — Other Ambulatory Visit: Payer: Self-pay | Admitting: Family Medicine

## 2019-06-15 DIAGNOSIS — Z1231 Encounter for screening mammogram for malignant neoplasm of breast: Secondary | ICD-10-CM

## 2020-03-26 ENCOUNTER — Ambulatory Visit
Admission: RE | Admit: 2020-03-26 | Discharge: 2020-03-26 | Disposition: A | Discharge status: Home / Self Care | Payer: BC Managed Care – PPO | Source: Ambulatory Visit | Attending: Family Medicine | Admitting: Family Medicine

## 2020-03-26 DIAGNOSIS — Z1231 Encounter for screening mammogram for malignant neoplasm of breast: Secondary | ICD-10-CM | POA: Diagnosis not present

## 2021-07-25 ENCOUNTER — Other Ambulatory Visit: Payer: Self-pay | Admitting: Family Medicine

## 2021-07-25 DIAGNOSIS — Z1231 Encounter for screening mammogram for malignant neoplasm of breast: Secondary | ICD-10-CM

## 2021-08-07 ENCOUNTER — Emergency Department
Admission: EM | Admit: 2021-08-07 | Discharge: 2021-08-07 | Disposition: A | Discharge status: Home / Self Care | Payer: BC Managed Care – PPO | Attending: Emergency Medicine | Admitting: Emergency Medicine

## 2021-08-07 ENCOUNTER — Encounter: Payer: Self-pay | Admitting: Emergency Medicine

## 2021-08-07 ENCOUNTER — Other Ambulatory Visit: Payer: Self-pay

## 2021-08-07 DIAGNOSIS — K529 Noninfective gastroenteritis and colitis, unspecified: Secondary | ICD-10-CM

## 2021-08-07 DIAGNOSIS — Z87891 Personal history of nicotine dependence: Secondary | ICD-10-CM | POA: Diagnosis not present

## 2021-08-07 DIAGNOSIS — R112 Nausea with vomiting, unspecified: Secondary | ICD-10-CM | POA: Diagnosis not present

## 2021-08-07 DIAGNOSIS — R6883 Chills (without fever): Secondary | ICD-10-CM | POA: Insufficient documentation

## 2021-08-07 DIAGNOSIS — R109 Unspecified abdominal pain: Secondary | ICD-10-CM | POA: Insufficient documentation

## 2021-08-07 DIAGNOSIS — R61 Generalized hyperhidrosis: Secondary | ICD-10-CM | POA: Insufficient documentation

## 2021-08-07 DIAGNOSIS — E119 Type 2 diabetes mellitus without complications: Secondary | ICD-10-CM | POA: Insufficient documentation

## 2021-08-07 DIAGNOSIS — R197 Diarrhea, unspecified: Secondary | ICD-10-CM | POA: Diagnosis not present

## 2021-08-07 HISTORY — DX: Type 2 diabetes mellitus without complications: E11.9

## 2021-08-07 LAB — CBC
HCT: 42.1 % (ref 36.0–46.0)
Hemoglobin: 14.7 g/dL (ref 12.0–15.0)
MCH: 31.9 pg (ref 26.0–34.0)
MCHC: 34.9 g/dL (ref 30.0–36.0)
MCV: 91.3 fL (ref 80.0–100.0)
Platelets: 466 10*3/uL — ABNORMAL HIGH (ref 150–400)
RBC: 4.61 MIL/uL (ref 3.87–5.11)
RDW: 13.7 % (ref 11.5–15.5)
WBC: 7.2 10*3/uL (ref 4.0–10.5)
nRBC: 0 % (ref 0.0–0.2)

## 2021-08-07 LAB — URINALYSIS, COMPLETE (UACMP) WITH MICROSCOPIC
Glucose, UA: NEGATIVE mg/dL
Hgb urine dipstick: NEGATIVE
Ketones, ur: 20 mg/dL — AB
Leukocytes,Ua: NEGATIVE
Nitrite: NEGATIVE
Protein, ur: 30 mg/dL — AB
Specific Gravity, Urine: >1.046 — ABNORMAL HIGH (ref 1.005–1.030)
pH: 5 (ref 5.0–8.0)

## 2021-08-07 LAB — COMPREHENSIVE METABOLIC PANEL
ALT: 36 U/L (ref 0–44)
AST: 27 U/L (ref 15–41)
Albumin: 4.7 g/dL (ref 3.5–5.0)
Alkaline Phosphatase: 100 U/L (ref 38–126)
Anion gap: 12 (ref 5–15)
BUN: 17 mg/dL (ref 6–20)
CO2: 25 mmol/L (ref 22–32)
Calcium: 9.9 mg/dL (ref 8.9–10.3)
Chloride: 99 mmol/L (ref 98–111)
Creatinine, Ser: 0.82 mg/dL (ref 0.44–1.00)
GFR, Estimated: >60 mL/min (ref >60)
Glucose, Bld: 94 mg/dL (ref 70–99)
Potassium: 4 mmol/L (ref 3.5–5.1)
Sodium: 136 mmol/L (ref 135–145)
Total Bilirubin: 1 mg/dL (ref 0.3–1.2)
Total Protein: 7.8 g/dL (ref 6.5–8.1)

## 2021-08-07 LAB — LIPASE, BLOOD: Lipase: 20 U/L (ref 11–51)

## 2021-08-07 MED ORDER — ONDANSETRON 4 MG PO TBDP: 4.0000 mg | ORAL_TABLET | Freq: Three times a day (TID) | ORAL | 0 refills | Status: AC | PRN

## 2021-08-07 MED ORDER — ONDANSETRON HCL 4 MG/2ML IJ SOLN
4.0000 mg | Freq: Once | INTRAMUSCULAR | Status: AC
  Administered 2021-08-07: 4 mg via INTRAVENOUS
  Filled 2021-08-07: qty 2

## 2021-08-07 MED ORDER — SODIUM CHLORIDE 0.9 % IV BOLUS
1000.0000 mL | Freq: Once | INTRAVENOUS | Status: AC
  Administered 2021-08-07: 1000 mL via INTRAVENOUS

## 2021-08-07 NOTE — ED Triage Notes (Signed)
Pt comes into the ED via POV c/o emesis and diarrhea x 4 days.  Pt is diabetic and unable to keep meds and food down at this time.  PT does admit to having pneumonia 2 weeks ago and was on antibiotics, but those are completed.  Pt denies coming in contact with anyone with COVID that she is aware of.  Pt currently has even and unlabored respirations.

## 2021-08-07 NOTE — ED Provider Notes (Signed)
[facility]  ____________________________________________   Event Date/Time   First MD Initiated Contact with Patient 08/07/21 1616     (approximate)  I have reviewed the triage vital signs and the nursing notes.   HISTORY  Chief Complaint Emesis and Diarrhea    HPI Denise [name] is a 50 y.o. female past medical history of diabetes, status with splenectomy. Symptoms started 4 days ago. She has had nausea and vomiting, inability to tolerate PO. She also describes chills and night sweats. Pt endorses some abdominal cramping, but persistent abdominal pain. Diarrhea started last PM.  She is having about 1 episode of diarrhea per day when she is not eating.  More frequently when she takes p.o.  No blood in her stool.  She did have a pneumonia about 2 weeks ago and had antibiotics for this but that is now resolved.  Denies any ongoing cough or shortness of breath.         Past Medical History:  Diagnosis Date  . Diabetes mellitus without complication ([place])   . Kidney stones   . Pancreatitis     There are no problems to display for this patient.   Past Surgical History:  Procedure Laterality Date  . RENAL BIOPSY    . uterine ablation      Prior to Admission medications   Medication Sig Start Date End Date Taking? Authorizing Provider  ADVAIR DISKUS 250-50 MCG/DOSE AEPB Inhale 1 puff into the lungs every 12 (twelve) hours. 10/02/17   [provider]  fluticasone (FLONASE) 50 MCG/ACT nasal spray Place 2 sprays into the nose daily. 12/17/16 12/17/17  [provider]  metoprolol succinate (TOPROL-XL) 25 MG 24 hr tablet Take 1 tablet by mouth daily. 10/22/17   [provider]  naproxen (NAPROSYN) 500 MG tablet Take 1 tablet (500 mg total) by mouth 2 (two) times daily with a meal. 03/07/19   Paulette Blanch, MD  ondansetron (ZOFRAN ODT) 4 MG disintegrating tablet Take 1 tablet (4 mg total) by mouth every 8 (eight) hours as needed for  nausea or vomiting. 11/02/17   Eula Listen, MD  oxyCODONE-acetaminophen (PERCOCET/ROXICET) 5-325 MG tablet Take 1 tablet by mouth every 4 (four) hours as needed for severe pain. 03/07/19   Paulette Blanch, MD  traMADol (ULTRAM) 50 MG tablet Take 1 tablet by mouth every 8 (eight) hours as needed. 09/10/17   [provider]  zolpidem (AMBIEN) 10 MG tablet Take 1 tablet by mouth daily. 10/22/17   [provider]    Allergies Penicillins, Amoxicillin, and Avelox [moxifloxacin]  Family History  Problem Relation Age of Onset  . Prostate cancer Father   . Breast cancer Paternal Aunt        2's  . Prostate cancer Paternal Uncle        4 pat unlces   . Brain cancer Paternal Grandmother     Social History Social History   Tobacco Use  . Smoking status: Former    Packs/day: 0.50    Types: Cigarettes  . Smokeless tobacco: Never  Vaping Use  . Vaping Use: Every day  Substance Use Topics  . Alcohol use: Yes    Comment: rarely    Review of Systems   Review of Systems  Constitutional:  Positive for appetite change. Negative for chills and fever.  Respiratory:  Negative for shortness of breath.   Gastrointestinal:  Positive for diarrhea, nausea and vomiting. Negative for abdominal pain and blood in stool.  Genitourinary:  Negative for dysuria.  Neurological:  Positive for headaches.  All other systems reviewed and are negative.  Physical Exam Updated Vital Signs BP (!) 155/86 (BP Location: Left Arm)   Pulse 95   Temp 99 F (37.2 C) (Oral)   Resp 17   Ht 5\' 2"  (1.575 m)   Wt 77.1 kg   SpO2 99%   BMI 31.09 kg/m   Physical Exam Vitals and nursing note reviewed.  Constitutional:      General: She is not in acute distress.    Appearance: Normal appearance.  HENT:     Head: Normocephalic and atraumatic.  Eyes:     General: No scleral icterus.    Conjunctiva/sclera: Conjunctivae normal.  Pulmonary:     Effort: Pulmonary effort is normal. No  respiratory distress.     Breath sounds: No stridor.  Abdominal:     General: Abdomen is flat. There is no distension.     Palpations: Abdomen is soft.     Tenderness: There is no abdominal tenderness. There is no guarding.  Musculoskeletal:        General: No deformity or signs of injury.     Cervical back: Normal range of motion.  Skin:    General: Skin is dry.     Coloration: Skin is not jaundiced or pale.  Neurological:     General: No focal deficit present.     Mental Status: She is alert and oriented to person, place, and time. Mental status is at baseline.  Psychiatric:        Mood and Affect: Mood normal.        Behavior: Behavior normal.     LABS (all labs ordered are listed, but only abnormal results are displayed)  Labs Reviewed  CBC - Abnormal; Notable for the following components:      Result Value   Platelets 466 (*)    All other components within normal limits  URINALYSIS, COMPLETE (UACMP) WITH MICROSCOPIC - Abnormal; Notable for the following components:   Color, Urine AMBER (*)    APPearance CLOUDY (*)    Specific Gravity, Urine >1.046 (*)    Bilirubin Urine SMALL (*)    Ketones, ur 20 (*)    Protein, ur 30 (*)    Bacteria, UA FEW (*)    All other components within normal limits  LIPASE, BLOOD  COMPREHENSIVE METABOLIC PANEL   ____________________________________________  EKG  N/a ____________________________________________  RADIOLOGY Almeta Monas, personally viewed and evaluated these images (plain radiographs) as part of my medical decision making, as well as reviewing the written report by the radiologist.  ED MD interpretation:  n/a    ____________________________________________   PROCEDURES  Procedure(s) performed (including Critical Care):  Procedures   ____________________________________________   INITIAL IMPRESSION / ASSESSMENT AND PLAN / ED COURSE     50 year old female presents with nausea vomiting diarrhea.   She has an occasional abdominal cramping but no significant pain.  Vital signs within normal limits.  Labs obtained from triage are reassuring, she has no leukocytosis, AKI or electrolyte abnormality.  UA with some ketones but no obvious infection.  She was given fluids and Zofran.  On my assessment she appears well.  Her abdomen is benign.  Suspect noncomplicated viral gastroenteritis.  Will p.o. challenge and discharge with Zofran.  We discussed return precautions.      ____________________________________________   FINAL CLINICAL IMPRESSION(S) / ED DIAGNOSES  Final diagnoses:  None     ED Discharge  Orders     None        Note:  This document was prepared using Dragon voice recognition software and may include unintentional dictation errors.    Rada Hay, MD 08/08/21 [phone]

## 2021-08-07 NOTE — ED Provider Notes (Signed)
Emergency Medicine Provider Triage Evaluation Note  Denise [name] , a 50 y.o. female  was evaluated in triage.  Pt complains of nausea, vomiting, and diarrhea. Type 2 diabetic. Pneumonia 2 weeks ago. Finished antibiotics. No fever.  Review of Systems  Positive: Abdominal pain, nausea, vomiting Negative: Fever  Physical Exam  There were no vitals taken for this visit. Gen:   Awake, no distress   Resp:  Normal effort  MSK:   Moves extremities without difficulty  Other:    Medical Decision Making  Medically screening exam initiated at 2:24 PM.  Appropriate orders placed.  [name] was informed that the remainder of the evaluation will be completed by another provider, this initial triage assessment does not replace that evaluation, and the importance of remaining in the ED until their evaluation is complete.    Victorino Dike, FNP 08/07/21 1428    Lucrezia Starch, MD 08/07/21 1459

## 2022-03-06 ENCOUNTER — Other Ambulatory Visit: Payer: Self-pay | Admitting: Family Medicine

## 2022-03-06 ENCOUNTER — Ambulatory Visit
Admission: RE | Admit: 2022-03-06 | Discharge: 2022-03-06 | Disposition: A | Discharge status: Home / Self Care | Payer: BC Managed Care – PPO | Source: Ambulatory Visit | Attending: Family Medicine | Admitting: Family Medicine

## 2022-03-06 DIAGNOSIS — R112 Nausea with vomiting, unspecified: Secondary | ICD-10-CM | POA: Diagnosis present

## 2022-03-06 DIAGNOSIS — R1013 Epigastric pain: Secondary | ICD-10-CM

## 2022-03-06 DIAGNOSIS — Z8719 Personal history of other diseases of the digestive system: Secondary | ICD-10-CM

## 2022-03-06 DIAGNOSIS — R748 Abnormal levels of other serum enzymes: Secondary | ICD-10-CM

## 2022-03-06 MED ORDER — IOHEXOL 300 MG/ML  SOLN
100.0000 mL | Freq: Once | INTRAMUSCULAR | Status: AC | PRN
  Administered 2022-03-06: 100 mL via INTRAVENOUS

## 2022-11-18 IMAGING — CT CT ABD-PELV W/ CM
2 of 5 series · 16 of 46 positions shown, 18 images · IV contrast (APPLIED)
Comparison: Abdomen CT on 12/11/2017

CLINICAL DATA: Left abdominal and epigastric pain. Nausea and
vomiting. Personal history of pancreatitis.

EXAM:
CT ABDOMEN AND PELVIS WITH CONTRAST
TECHNIQUE: Multidetector CT imaging of the abdomen and pelvis was performed
using the standard protocol following bolus administration of
intravenous contrast.

[Series 2: routine abd/pel with · axial · 0.80mm/px · z∈[-698,-273]mm · 13 of 96 slices shown, 15 images]
[im 6/96  soft-tissue]
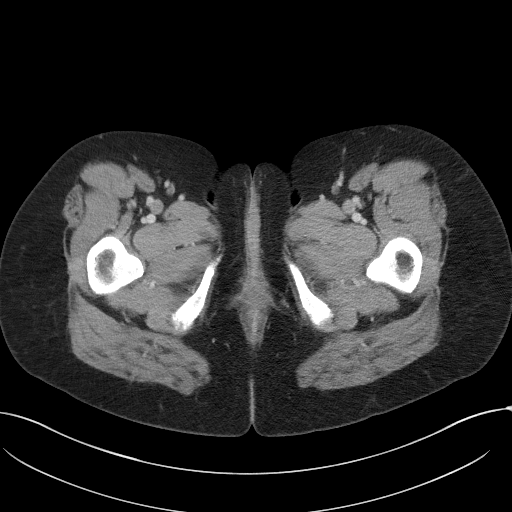
[im 6/96  bone]
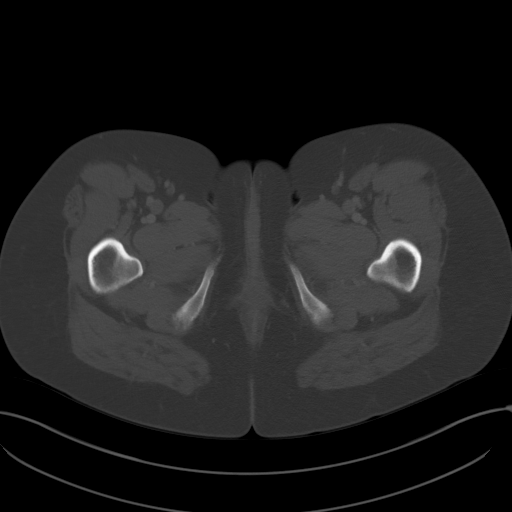
[im 16/96  soft-tissue]
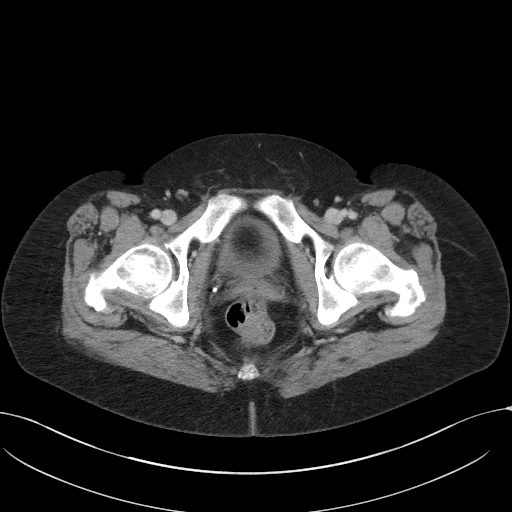
[im 21/96  soft-tissue]
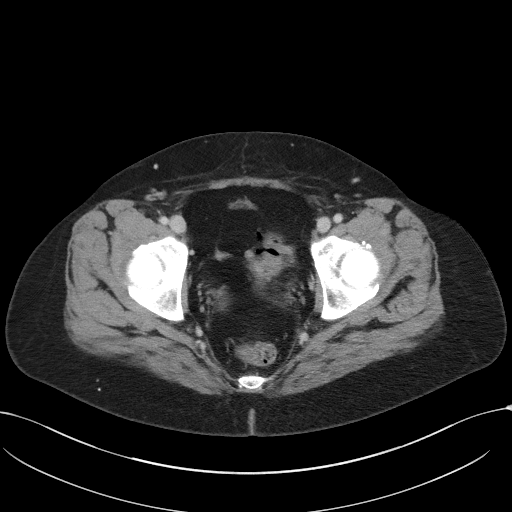
[im 26/96  soft-tissue]
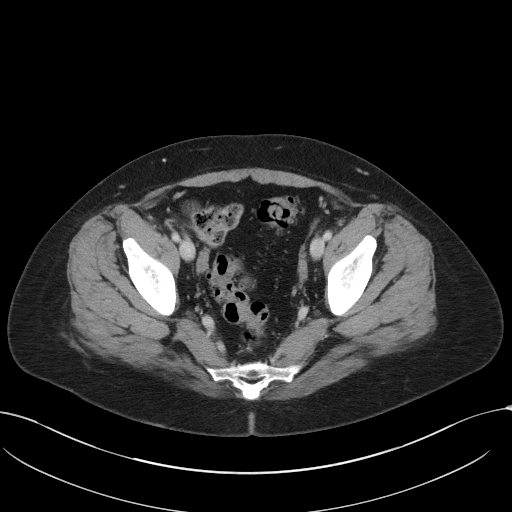
[im 36/96  soft-tissue]
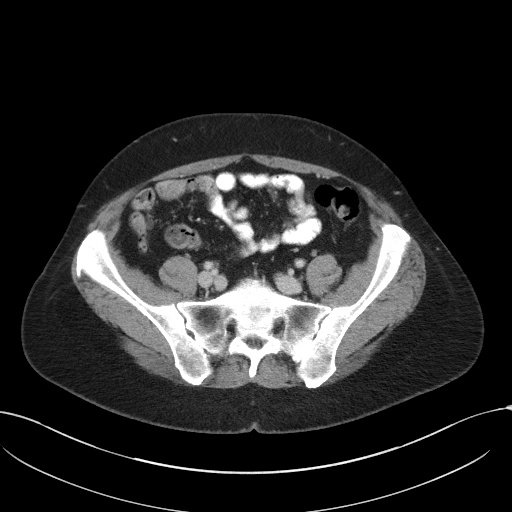
[im 41/96  soft-tissue]
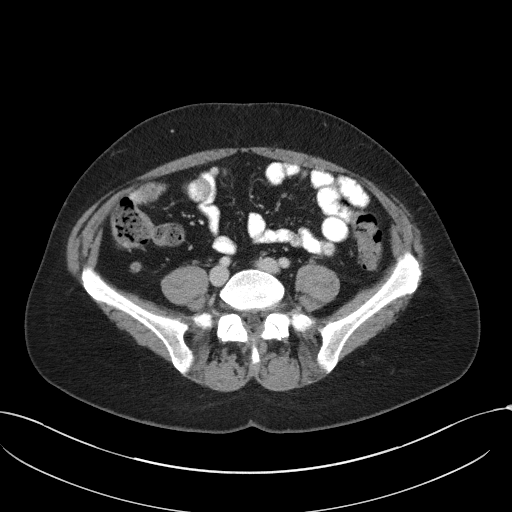
[im 51/96  soft-tissue]
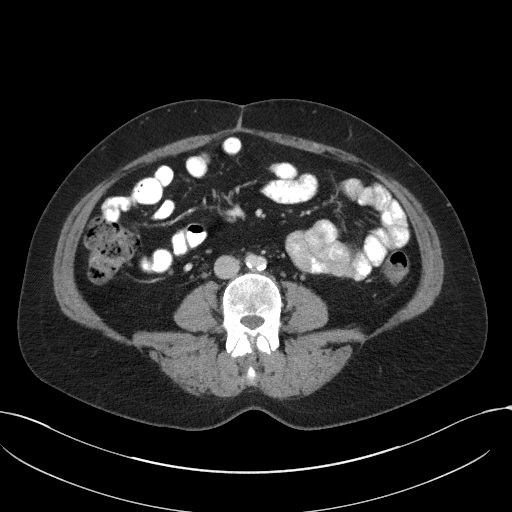
[im 56/96  soft-tissue]
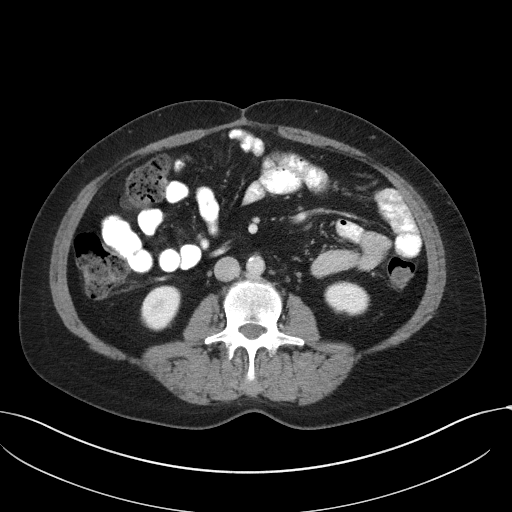
[im 61/96  soft-tissue]
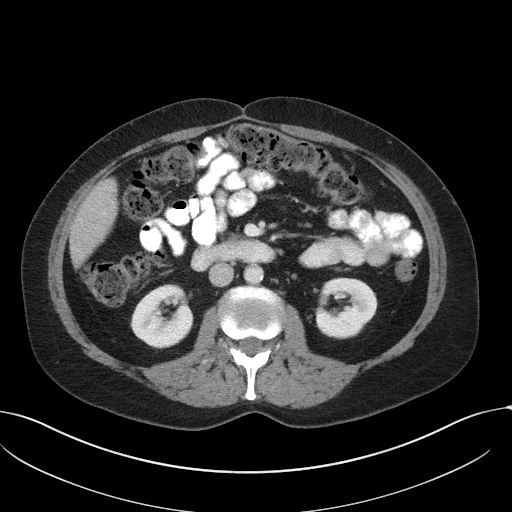
[im 61/96  bone]
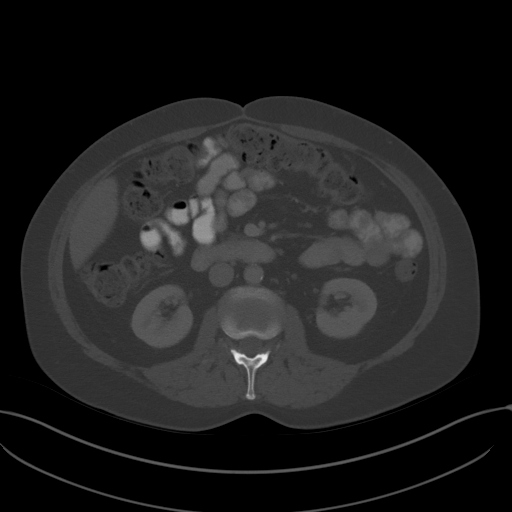
[im 71/96  soft-tissue]
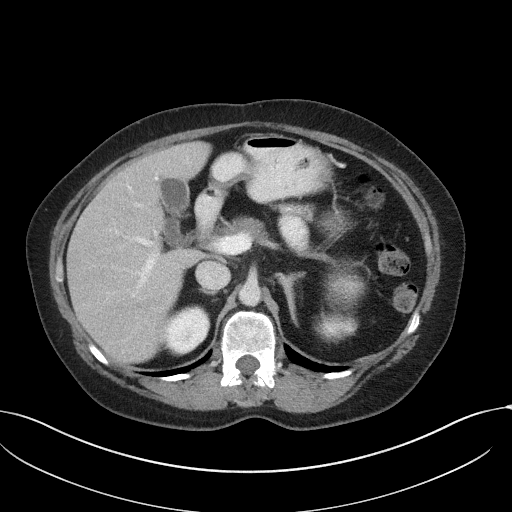
[im 76/96  soft-tissue]
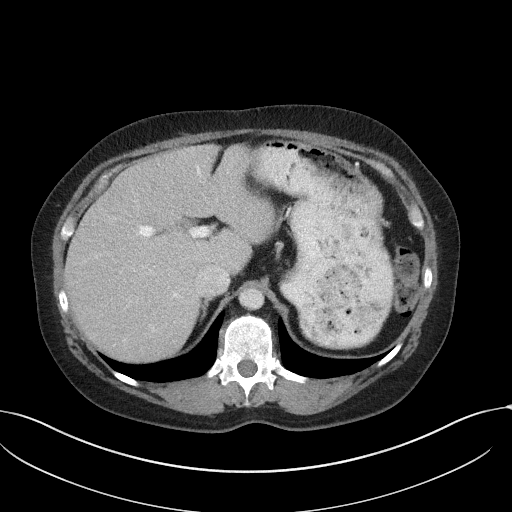
[im 81/96  soft-tissue]
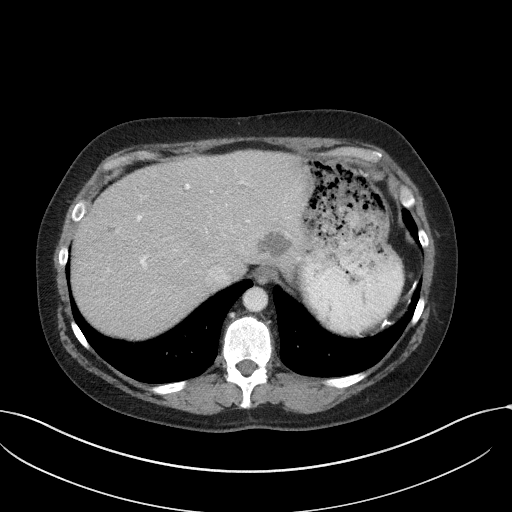
[im 91/96  soft-tissue]
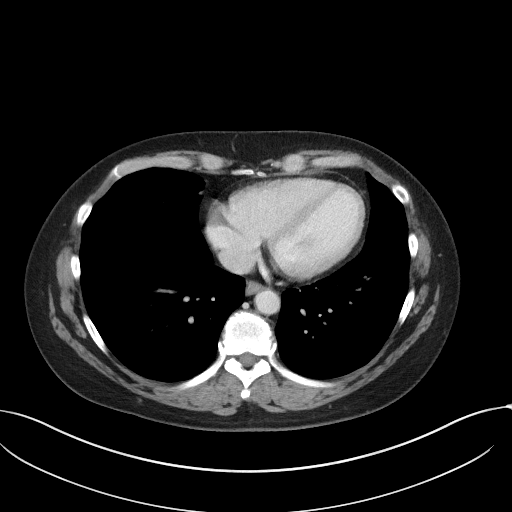

[Series 5: coronal st · coronal · 0.74mm/px · 3 of 93 slices shown]
[im 31/93  soft-tissue]
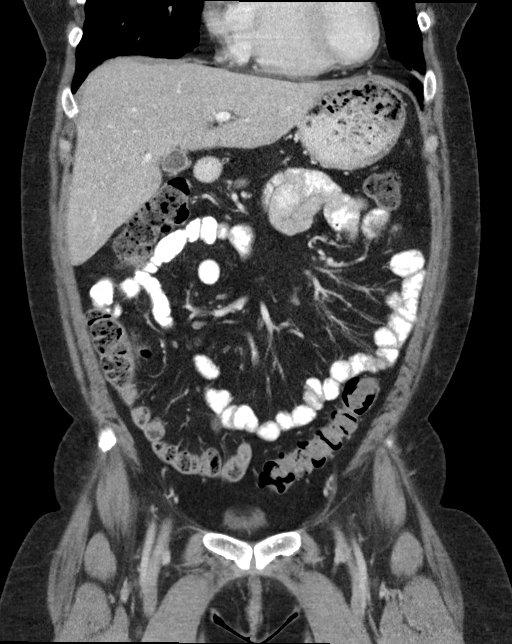
[im 41/93  soft-tissue]
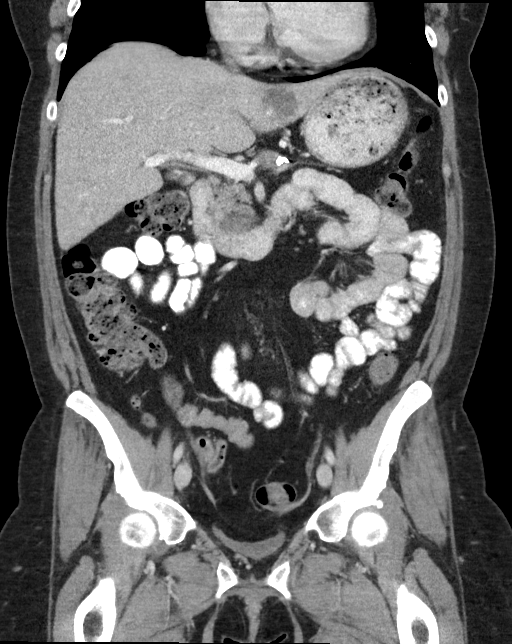
[im 52/93  soft-tissue]
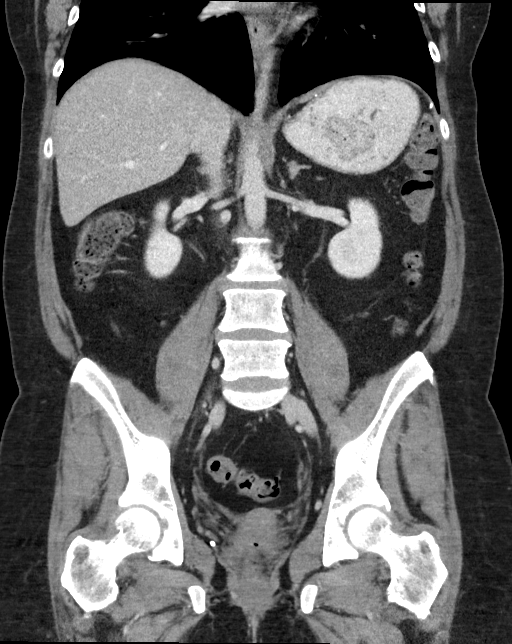

[16 of 46 positions shown; findings below may reference images not displayed]

RADIATION DOSE REDUCTION: This exam was performed according to the
departmental dose-optimization program which includes automated
exposure control, adjustment of the mA and/or kV according to
patient size and/or use of iterative reconstruction technique.

CONTRAST:  100mL OMNIPAQUE IOHEXOL 300 MG/ML  SOLN
FINDINGS: Lower Chest: No acute findings.

Hepatobiliary: A 3 cm benign hemangioma is again seen in the
posterior left hepatic lobe. 8 mm benign hemangioma is seen in the
medial dome of the right hepatic lobe. A sub-cm low-attenuation
lesion in the anterior segment of the right hepatic lobe is too
small to characterize, but most likely represents a tiny benign cyst
or hemangioma. Gallbladder is unremarkable. No evidence of biliary
ductal dilatation.

Pancreas: Patient has undergone distal pancreatectomy since prior
study. The remaining portion of the pancreatic head and body is
normal in appearance. No evidence of mass or inflammatory changes.

Spleen: Surgically absent.

Adrenals/Urinary Tract: No masses identified. No evidence of
ureteral calculi or hydronephrosis.

Stomach/Bowel: No evidence of obstruction, inflammatory process or
abnormal fluid collections. Normal appendix visualized.

Vascular/Lymphatic: No pathologically enlarged lymph nodes. No acute
vascular findings. Aortic atherosclerotic calcification noted.

Reproductive:  No mass or other significant abnormality.

Other:  None.

Musculoskeletal:  No suspicious bone lesions identified.
IMPRESSION: Interval distal pancreatectomy and splenectomy. No radiographic
evidence of acute pancreatitis or other acute findings.

Stable benign hepatic hemangiomas.

Aortic Atherosclerosis ([id number]-KOI.I).

## 2023-02-12 ENCOUNTER — Encounter: Payer: Self-pay | Admitting: *Deleted

## 2023-02-12 ENCOUNTER — Emergency Department
Admission: EM | Admit: 2023-02-12 | Discharge: 2023-02-13 | Disposition: A | Discharge status: Home / Self Care | Payer: Self-pay | Attending: Emergency Medicine | Admitting: Emergency Medicine

## 2023-02-12 ENCOUNTER — Other Ambulatory Visit: Payer: Self-pay

## 2023-02-12 DIAGNOSIS — R519 Headache, unspecified: Secondary | ICD-10-CM | POA: Insufficient documentation

## 2023-02-12 DIAGNOSIS — R718 Other abnormality of red blood cells: Secondary | ICD-10-CM | POA: Insufficient documentation

## 2023-02-12 DIAGNOSIS — R1084 Generalized abdominal pain: Secondary | ICD-10-CM | POA: Insufficient documentation

## 2023-02-12 DIAGNOSIS — E119 Type 2 diabetes mellitus without complications: Secondary | ICD-10-CM | POA: Insufficient documentation

## 2023-02-12 DIAGNOSIS — R197 Diarrhea, unspecified: Secondary | ICD-10-CM | POA: Insufficient documentation

## 2023-02-12 DIAGNOSIS — R112 Nausea with vomiting, unspecified: Secondary | ICD-10-CM | POA: Insufficient documentation

## 2023-02-12 DIAGNOSIS — Z20822 Contact with and (suspected) exposure to covid-19: Secondary | ICD-10-CM | POA: Insufficient documentation

## 2023-02-12 LAB — CBC
HCT: 50.6 % — ABNORMAL HIGH (ref 36.0–46.0)
Hemoglobin: 16.7 g/dL — ABNORMAL HIGH (ref 12.0–15.0)
MCH: 28.3 pg (ref 26.0–34.0)
MCHC: 33 g/dL (ref 30.0–36.0)
MCV: 85.8 fL (ref 80.0–100.0)
Platelets: 392 10*3/uL (ref 150–400)
RBC: 5.9 MIL/uL — ABNORMAL HIGH (ref 3.87–5.11)
RDW: 14.6 % (ref 11.5–15.5)
WBC: 9.3 10*3/uL (ref 4.0–10.5)
nRBC: 0 % (ref 0.0–0.2)

## 2023-02-12 LAB — URINALYSIS, ROUTINE W REFLEX MICROSCOPIC
Bilirubin Urine: NEGATIVE
Glucose, UA: NEGATIVE mg/dL
Hgb urine dipstick: NEGATIVE
Ketones, ur: 80 mg/dL — AB
Nitrite: NEGATIVE
Protein, ur: 30 mg/dL — AB
Specific Gravity, Urine: 1.031 — ABNORMAL HIGH (ref 1.005–1.030)
pH: 5 (ref 5.0–8.0)

## 2023-02-12 LAB — LIPASE, BLOOD: Lipase: 21 U/L (ref 11–51)

## 2023-02-12 LAB — COMPREHENSIVE METABOLIC PANEL
ALT: 21 U/L (ref 0–44)
AST: 14 U/L — ABNORMAL LOW (ref 15–41)
Albumin: 4.5 g/dL (ref 3.5–5.0)
Alkaline Phosphatase: 79 U/L (ref 38–126)
Anion gap: 17 — ABNORMAL HIGH (ref 5–15)
BUN: 16 mg/dL (ref 6–20)
CO2: 23 mmol/L (ref 22–32)
Calcium: 9.7 mg/dL (ref 8.9–10.3)
Chloride: 97 mmol/L — ABNORMAL LOW (ref 98–111)
Creatinine, Ser: 0.8 mg/dL (ref 0.44–1.00)
GFR, Estimated: >60 mL/min (ref >60)
Glucose, Bld: 92 mg/dL (ref 70–99)
Potassium: 4.1 mmol/L (ref 3.5–5.1)
Sodium: 137 mmol/L (ref 135–145)
Total Bilirubin: 1.2 mg/dL (ref 0.3–1.2)
Total Protein: 7.7 g/dL (ref 6.5–8.1)

## 2023-02-12 NOTE — ED Triage Notes (Signed)
Pt reports abd pain, n/v, headache.  Sx for 6 days.  Pt was seen at [facility] this week and is not improving.  Pt is diabetic.  Pt alert

## 2023-02-12 NOTE — ED Notes (Signed)
PT brought to ed rm 8 at this time, this RN now assuming care.

## 2023-02-13 ENCOUNTER — Emergency Department: Payer: Self-pay

## 2023-02-13 LAB — RESP PANEL BY RT-PCR (RSV, FLU A&B, COVID)  RVPGX2
Influenza A by PCR: NEGATIVE
Influenza B by PCR: NEGATIVE
Resp Syncytial Virus by PCR: NEGATIVE
SARS Coronavirus 2 by RT PCR: NEGATIVE

## 2023-02-13 MED ORDER — KETOROLAC TROMETHAMINE 15 MG/ML IJ SOLN
15.0000 mg | Freq: Once | INTRAMUSCULAR | Status: AC
  Administered 2023-02-13: 15 mg via INTRAVENOUS
  Filled 2023-02-13: qty 1

## 2023-02-13 MED ORDER — MORPHINE SULFATE (PF) 4 MG/ML IV SOLN
4.0000 mg | Freq: Once | INTRAVENOUS | Status: AC
  Administered 2023-02-13: 4 mg via INTRAVENOUS
  Filled 2023-02-13: qty 1

## 2023-02-13 MED ORDER — METOCLOPRAMIDE HCL 5 MG/ML IJ SOLN
10.0000 mg | Freq: Once | INTRAMUSCULAR | Status: AC
  Administered 2023-02-13: 10 mg via INTRAVENOUS
  Filled 2023-02-13: qty 2

## 2023-02-13 MED ORDER — SODIUM CHLORIDE 0.9 % IV BOLUS
1000.0000 mL | Freq: Once | INTRAVENOUS | Status: AC
  Administered 2023-02-13: 1000 mL via INTRAVENOUS

## 2023-02-13 MED ORDER — SODIUM CHLORIDE 0.9 % IV BOLUS: 1000.0000 mL | Freq: Once | INTRAVENOUS | Status: DC

## 2023-02-13 MED ORDER — ONDANSETRON HCL 4 MG/2ML IJ SOLN
4.0000 mg | Freq: Once | INTRAMUSCULAR | Status: AC
  Administered 2023-02-13: 4 mg via INTRAVENOUS
  Filled 2023-02-13: qty 2

## 2023-02-13 MED ORDER — ACETAMINOPHEN 500 MG PO TABS
1000.0000 mg | ORAL_TABLET | Freq: Once | ORAL | Status: AC
  Administered 2023-02-13: 1000 mg via ORAL
  Filled 2023-02-13: qty 2

## 2023-02-13 NOTE — Discharge Instructions (Addendum)
You were seen in the emergency department for 6 days of abdominal pain with nausea, vomiting and dehydration.  He also developed a headache while you are in the emergency department.  Your lab work looks like signs of dehydration.  Your COVID and influenza testing were negative.  Your CT scan of your head that was normal.  It is important that you stay hydrated and drink plenty of fluids.  Try drinking Pedialyte, Gatorade or other fluids that have electrolytes in it.  Follow-up closely with your primary care physician.  Return to the emergency department if you have any worsening symptoms.

## 2023-02-13 NOTE — ED Notes (Signed)
Patient transported to CT 

## 2023-02-13 NOTE — ED Notes (Signed)
ED Provider at bedside. 

## 2023-02-13 NOTE — ED Notes (Signed)
Patient transported back from CT 

## 2023-02-13 NOTE — ED Provider Notes (Signed)
[facility] Provider Note    Event Date/Time   First MD Initiated Contact with Patient 02/13/23 0011     (approximate)   History   Abdominal Pain and Emesis   HPI  Denise [name] is a 52 y.o. 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  States that she is unable to keep anything down.  Evaluated at urgent care approximately 6 days ago and had a CT scan done and lab work done.  Was treated with ketorolac and Zofran.  Home Zofran and continues to have multiple episodes of vomiting.  Does endorse diarrhea.  Denies any blood in her stool.  Vague abdominal discomfort.  Denies any chest pain or shortness of breath.  Mild headache that started today.  Denies fever.  Denies dysuria, urinary urgency or frequency.     Physical Exam   Triage Vital Signs: ED Triage Vitals  Enc Vitals Group     BP 02/12/23 2009 (!) 156/99     Pulse Rate 02/12/23 2009 (!) 107     Resp 02/12/23 2009 18     Temp 02/12/23 2009 98.3 F (36.8 C)     Temp Source 02/12/23 2009 Oral     SpO2 02/12/23 2009 97 %     Weight 02/12/23 2008 155 lb (70.3 kg)     Height 02/12/23 2008 5\' 3"  (1.6 m)     Head Circumference --      Peak Flow --      Pain Score 02/12/23 2007 8     Pain Loc --      Pain Edu? --      Excl. in [place]? --     Most recent vital signs: Vitals:   02/13/23 0100 02/13/23 0200  BP: (!) 119/54 136/63  Pulse: 63 88  Resp:  20  Temp:    SpO2: 97% 100%    Physical Exam Constitutional:      Appearance: She is well-developed.  HENT:     Head: Atraumatic.  Eyes:     Conjunctiva/sclera: Conjunctivae normal.  Cardiovascular:     Rate and Rhythm: Regular rhythm.  Pulmonary:     Effort: No respiratory distress.  Abdominal:     General: A surgical scar is present. There is  no distension.     Tenderness: There is generalized abdominal tenderness. Negative signs include Murphy's sign and McBurney's sign.  Musculoskeletal:        General: Normal range of motion.     Cervical back: Full passive range of motion without pain and normal range of motion. No rigidity.  Skin:    General: Skin is warm.     Capillary Refill: Capillary refill takes 2 to 3 seconds.  Neurological:     General: No focal deficit present.     Mental Status: She is alert. Mental status is at baseline.  Psychiatric:        Mood and Affect: Mood normal.     IMPRESSION / MDM / [place] / ED COURSE  I reviewed the triage vital signs and the nursing notes. On chart review patient had lab work and CT scan done approximately 6 days ago.  CT scan showed no acute findings.  Patient did not have any lab workup normalities other than a leukocytosis.  Differential diagnosis including pancreatitis, gastroenteritis, viral illness  including COVID/influenza, urinary tract infection, acute cholecystitis, symptomatic cholelithiasis  No tachycardic or bradycardic dysrhythmias while on cardiac telemetry.  RADIOLOGY I independently reviewed imaging, my interpretation of imaging: CT scan of the head without signs of intracranial hemorrhage.  Read as no acute findings.  LABS (all labs ordered are listed, but only abnormal results are displayed) Labs interpreted as -    Labs Reviewed  COMPREHENSIVE METABOLIC PANEL - Abnormal; Notable for the following components:      Result Value   Chloride 97 (*)    AST 14 (*)    Anion gap 17 (*)    All other components within normal limits  CBC - Abnormal; Notable for the following components:   RBC 5.90 (*)    Hemoglobin 16.7 (*)    HCT 50.6 (*)    All other components within normal limits  URINALYSIS, ROUTINE W REFLEX MICROSCOPIC - Abnormal; Notable for the following components:   Color, Urine YELLOW (*)    APPearance HAZY (*)    Specific Gravity,  Urine 1.031 (*)    Ketones, ur 80 (*)    Protein, ur 30 (*)    Leukocytes,Ua TRACE (*)    Bacteria, UA RARE (*)    All other components within normal limits  RESP PANEL BY RT-PCR (RSV, FLU A&B, COVID)  RVPGX2  LIPASE, BLOOD     MDM  Lab work was significant dehydration with large ketones.  Has an elevated anion gap of 17 which is likely in the setting of dehydration and elevated ketones.  No significant acidosis on lab work.  No significant electrolyte abnormalities.  No significant leukocytosis.  Elevated hemoglobin level at 16.7 which is likely in the setting of dehydration.  Urine without signs of urinary tract infection.  Sent for reflex culture.  CT scan of the head with no signs of intracranial hemorrhage.  Clinical picture is not consistent with meningitis.  Multiple repeat abdominal exams and continues to have a only a vague tenderness to palpation.  No focal tenderness.  Patient states that she does not feel like her abdominal pain is severe and is only secondary to being sore from throwing up so much.  Wishes to hold off for repeat CT scan at this time.  No focal right lower quadrant abdominal tenderness have a low suspicion for acute appendicitis.  No significant right upper quadrant tenderness and negative Murphy sign low suspicion for acute cholecystitis.  Normal T. bili.  Patient given multiple liters of IV fluids, antiemetics and pain medication  Patient complaining of headache, no change in vision, no jaw claudication or temporal tenderness, clinical picture is not consistent with a giant cell arteritis.  Patient states that her abdominal pain is improved.  Continues to have a headache.  No history of migraines.  Discussed symptomatic treatment, patient has Zofran at home.  Given return precautions for any worsening or ongoing symptoms.  Discussed close follow-up with primary care provider.     PROCEDURES:  Critical Care performed: No  Procedures  Patient's  presentation is most consistent with acute presentation with potential threat to life or bodily function.   MEDICATIONS ORDERED IN ED: Medications  sodium chloride 0.9 % bolus 1,000 mL (0 mLs Intravenous Stopped 02/13/23 0146)  ondansetron (ZOFRAN) injection 4 mg (4 mg Intravenous Given 02/13/23 0038)  ketorolac (TORADOL) 15 MG/ML injection 15 mg (15 mg Intravenous Given 02/13/23 0040)  sodium chloride 0.9 % bolus 1,000 mL (0 mLs Intravenous Stopped 02/13/23 0236)  metoCLOPramide (REGLAN) injection 10  mg (10 mg Intravenous Given 02/13/23 0151)  acetaminophen (TYLENOL) tablet 1,000 mg (1,000 mg Oral Given 02/13/23 0151)  morphine (PF) 4 MG/ML injection 4 mg (4 mg Intravenous Given 02/13/23 0416)    FINAL CLINICAL IMPRESSION(S) / ED DIAGNOSES   Final diagnoses:  Nausea vomiting and diarrhea  Acute nonintractable headache, unspecified headache type     Rx / DC Orders   ED Discharge Orders     None        Note:  This document was prepared using Dragon voice recognition software and may include unintentional dictation errors.   Nathaniel Man, MD 02/13/23 [phone]

## 2024-04-19 ENCOUNTER — Other Ambulatory Visit: Payer: Self-pay | Admitting: Family Medicine

## 2024-04-19 DIAGNOSIS — Z1231 Encounter for screening mammogram for malignant neoplasm of breast: Secondary | ICD-10-CM

## 2024-04-28 ENCOUNTER — Ambulatory Visit
Admission: RE | Admit: 2024-04-28 | Discharge: 2024-04-28 | Disposition: A | Discharge status: Home / Self Care | Payer: Self-pay | Source: Ambulatory Visit | Attending: Family Medicine | Admitting: Family Medicine

## 2024-04-28 DIAGNOSIS — Z1231 Encounter for screening mammogram for malignant neoplasm of breast: Secondary | ICD-10-CM | POA: Insufficient documentation

## 2024-07-07 NOTE — ED Provider Notes (Addendum)
 [facility] Emergency Department Provider Note  ED CLINICAL IMPRESSION:   Final diagnoses:  Flank pain (Primary)    MEDICAL DECISION MAKING:   Initial Assessment/Plan  Medical Decision Making 53 year old female with a history of nephrolithiasis presented with 36 hours of severe right-sided lower back and kidney pain radiating to the groin, associated with oliguria, nausea, vomiting, and chills. She has a prior history of passing kidney stones and a remote history of partial pancreatectomy for pancreatitis. point-of-care ultrasound showed no hydronephrosis. She reported some blood in the urine and decreased oral intake.  Differential diagnosis includes, but is not limited to: - Nephrolithiasis (Renal Colic): Classic presentation with severe flank pain radiating to the groin, oliguria, hematuria, and history of prior stones; ultrasound findings and clinical course are consistent with a likely small, passable stone. - Musculoskeletal Back Pain: Considered but deemed unlikely given the absence of inciting trauma, lack of response to muscle relaxant, and classic renal colic symptoms.  Nephrolithiasis with associated oliguria and nausea/vomiting - Administered morphine  for pain management - Administered IV fluids - Administered antiemetic for nausea - Ordered CBC, CMP, and urinalysis - Monitor response to treatment and reassess pain and ability to urinate - Consider CT scan if symptoms do not improve or if further assessment is needed   Diagnostic orders as below. Orders Placed This Encounter  Procedures  . CBC w/ Differential  . Comprehensive metabolic panel  . Urinalysis with Microscopy (Clean Catch)  . ED Extra Tubes  . Ambulatory referral to Urology    ED Course  Abnormal Labs Reviewed  COMPREHENSIVE METABOLIC PANEL - Abnormal; Notable for the following components:      Result Value   Anion Gap 17 (*)    BUN 24 (*)    Glucose 110 (*)    Calcium 10.3 (*)    Albumin 5.3 (*)     Total Protein 9.1 (*)    ALT 35 (*)    All other components within normal limits  URINALYSIS WITH MICROSCOPY - Abnormal; Notable for the following components:   Protein, UA 100 mg/dL (*)    Ketones, UA 40 mg/dL (*)    Bilirubin, UA Moderate (*)    Blood, UA Large (*)    RBC, UA >30 (*)    Bacteria, UA Occasional (*)    Mucus, UA Many (*)    All other components within normal limits  CBC W/ AUTO DIFF - Abnormal; Notable for the following components:   WBC 11.7 (*)    RBC 5.73 (*)    HGB 16.6 (*)    HCT 49.7 (*)    Absolute Lymphocytes 4.6 (*)    All other components within normal limits    No orders to display        Final Assessment/Plan When considering the possible etiologies in combination with the clinical picture and work up, the symptoms/findings are most consistent with:  Final diagnoses:  Flank pain (Primary)   Assessment & Plan Acute left-sided nephrolithiasis presents with pain radiating to the groin, oliguria, nausea, and vomiting. Ultrasound indicates no hydronephrosis suggesting the stone is probably small enough to pass spontaneously. A CT scan was discussed but deferred due to insurance concerns. Opted for conservative management with symptom monitoring. Administer morphine  for pain, fluids, and antiemetics for nausea. Monitor symptoms and reassess after medication. Evaluated lab results and urine analysis. Considered a CT scan if symptoms persist or worsen. Workup consistent with likely dehydration with ketones in the urine. Suspect her glucose is elevated as well  as her LFTs secondary to vomiting. We did discuss getting this repeat in the outpatient setting. Set her home with flomax, zofran , oxy   Discharge Plan - Instructed to follow up with their PCP  - Instructed to return to the emergency department if symptoms worsen - Educated on return precautions  - The patient was sent home with the following medication(s): Discharge Medication List as of 07/07/2024   1:21 PM     START taking these medications   Details  ondansetron  (ZOFRAN -ODT) 4 MG disintegrating tablet Take 1 tablet (4 mg total) by mouth every eight (8) hours as needed for nausea for up to 7 days., Starting Thu 07/07/2024, Until Thu 07/14/2024 at 2359, Normal    oxyCODONE  (ROXICODONE ) 5 MG immediate release tablet Take 1 tablet (5 mg total) by mouth every six (6) hours as needed for pain for up to 5 days., Starting Thu 07/07/2024, Until Tue 07/12/2024 at 2359, Normal        I discussed the findings of the medical work-up, my rationale behind the medical decision making process and my treatment plan with the patient. The patient understands and is comfortable with the plan of care. All questions were answered.    MEDICAL HISTORY:    CHIEF COMPLAINT:  Flank Pain  HPI:  History of Present Illness Denise [name] is a 53 year old female with a history of kidney stones who presents with severe left-sided flank pain.  She has been experiencing severe left-sided flank pain for approximately 36 hours. The pain is severe, radiating from the flank to the groin, and is tender to touch over the kidney area. This episode is more intense than previous kidney stone episodes, with her stating, 'I've never hurt this bad in my kidney before.'  She has a history of kidney stones, with a CT scan from 2021 showing two stones on the right and one on the left. She passed a stone approximately three months ago without complications. Currently, she has decreased urination, nausea, vomiting, and chills, but no fever. The patient noticed some blood, but there was no blood in her vomit.  She has not been drinking much fluid recently and has tried taking a muscle relaxer without relief. She has a significant past medical history of pancreatitis, which required hospitalization and partial removal of her pancreas two years ago. She describes the pain from pancreatitis as 'the worst' she has experienced, but notes that  the current kidney pain is also severe.  She is concerned about the cost of a CT scan due to previous medical bills from her pancreatitis treatment. She was unable to see her primary care physician today and mentions a previous referral to urology that was not completed.     PHYSICAL EXAM   ED Vital Signs: Vitals:   07/07/24 1300 07/07/24 1301 07/07/24 1319 07/07/24 1349  BP: 131/79   126/61  Pulse:   80 88  Resp:   18 18  Temp:   36.4 C (97.5 F) 36.7 C (98 F)  TempSrc:   Temporal Temporal  SpO2:  98%  97%  Weight:      Height:       Physical Exam Vitals reviewed.  Constitutional:      General: She is not in acute distress.    Appearance: Normal appearance. She is not ill-appearing, toxic-appearing or diaphoretic.  HENT:     Head: Normocephalic and atraumatic.     Right Ear: External ear normal.     Left Ear: External  ear normal.     Nose: Nose normal.  Pulmonary:     Effort: Pulmonary effort is normal.  Abdominal:     General: Abdomen is flat. There is no distension.     Palpations: Abdomen is soft. There is no mass.     Tenderness: There is no abdominal tenderness. There is left CVA tenderness. There is no guarding.  Skin:    Coloration: Skin is not pale.  Neurological:     Mental Status: She is alert.  Psychiatric:        Judgment: Judgment normal.      PROCEDURES  Limited Renal Ulrasound ([id number]:[id number])  Indication: A focused ultrasound of the kidneys was performed to evaluate for hydronephrosis and nephrolithiasis. The ultrasound was performed with the following indications, as noted in the H&P: Abdominal or flank pain  Identified structures: Left kidney  Findings: Exam of the above structures revealed the following findings:   Hydronephrosis: Absent   If present: N/A   If present: N/A   Limitations: None.   Impression:  patient was evaluated for hydronephrosis in the setting of flank pain. Likely has a kidney stone in the absence of  hydronephrosis, suspect patient has a stone that will pass  Other: N/A     Interpreted by: Bernardino CHRISTELLA Gaster, DO    Pertinent labs & imaging results that were available during my care of the patient were reviewed by me and considered in my medical decision making. Labs and radiology studies included in my note may not constitute all ordered/reviewied labs during this encounter (see chart for details).  __________________________________________  Portions of this record have been created using Scientist, clinical (histocompatibility and immunogenetics). Dictation errors have been sought, but may not have been identified and corrected.    Gaster Bernardino Sharper, DO 07/07/24 1413    Gaster Bernardino Sharper, DO 07/07/24 1431

## 2024-07-09 NOTE — ED Provider Notes (Signed)
 ED Progress Note  July 09, 2024 1930    Assumed care of patient.  ED Handoff Patient Summary: Denise [name] is a 53 y.o. female w/ a history of former tobacco use disorder, COPD, T2DM, nephrolithiasis, splenectomy, pancreatitis who presents with left flank pain after recent ED visit.  Patient presented on 07/07/2024 for similar flank pain.  During that visit she had a CBC with a slightly elevated WBC at 11.7 and elevated hemoglobin at 16.6 but was otherwise normal.  CMP with an elevated anion gap and slightly elevated calcium and ALT but largely unremarkable.  Urinalysis with blood but otherwise negative.  She presented with ongoing pain but otherwise had no acute change in symptoms.  Awaiting CT scan noncontrast for evaluation of kidney stone.  Currently awaiting formal read of CT scan prior to reassessment anticipated discharge home.  Vitals:   07/09/24 1806  BP: 129/75  Pulse: 96  Resp: 16  Temp: 36.9 C (98.4 F)  TempSrc: Temporal  SpO2: 99%  Weight: 73 kg (161 lb)  Height: 162.6 cm (5' 4)    Medication during hospitalization      oxyCODONE  (ROXICODONE ) immediate release tablet 5 mg Admin Date 07/09/2024  18:11 Action Given Dose 5 mg Route Oral Site        Action List:  Follow up CT scan  8:30 PM CT scan without contrast is normal.  Specifically, no evidence of nephrolithiasis or bladder stone.  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  Upon reassessment, patient reports that she continues to have some left-sided flank pain.  In revisiting her history, she tells me that her symptoms started on the day of her prior presentation (07/07/2022).  She says that the pain goes from her left flank near the CVA area and radiates around to her abdomen.  Does not go down into her leg or up into her chest.  She says  it feels sharp, grabbing, like my past kidney stone.  Not worse with movement.  She notes that she was nauseated and vomited once today.  Says that she had chills but no known fever.  Denies cough, shortness of breath, abdominal pain other than in the left upper quadrant where this radiates.  She says that she is felt some pressure with peeing but did not have burning, frequency, hematuria, cloudy urine, malodor.  I reviewed her workup with her.  I advised that my overall assessment is that she likely had a kidney stone that was already passed or too small to be seen on CT scan given her typical pain and presence of hematuria.  However, I advised that there are other possibilities.  Specifically, given that she has a history of smoking I advised it is possible that she could have hematuria from a malignancy and back pain from another source such as MSK pain.  I advised that she needs to undergo a repeat urinalysis in 1 to 2 weeks and discussed possibility of cystoscopy even if she continues to have hematuria.  I considered possibility of aortic dissection but given normal caliber if aorta, lack of radiation in the superior and inferior direction, and endorsement of similarity to prior episode of nephrolithiasis feel very unlikely.  She expressed understanding of this and stated that she has an appointment with her primary care doctor from [facility] on September 9 and will discuss there.  I considered pancreatitis given that she has a history of this and her lipase was not checked last time, but given that the pain clearly goes over to the lower back feel this is less likely.  In particular, with a normal-appearing pancreas on CT scan -- albeit without contrast -- feel very low likelihood of this.  Discharge patient home with advice to continue Flomax, treat pain with over-the-counter Tylenol  and NSAIDs and prescription oxycodone , and follow-up with PCP for reassessment.  Return precautions with patient reviewed  prior to discharge.

## 2024-07-09 NOTE — ED Provider Notes (Signed)
 [facility] Emergency Department Provider Note   MEDICAL DECISION MAKING:   Diagnostic orders as below. Orders Placed This Encounter  Procedures  . CT Stone Study    Assessment/Plan patient presents for ongoing left flank pain. At last visit, patient had classical symptoms of kidney stone with hematuria, flank pain radiating to the groin but had absence of hydronephrosis on bedside ultrasound suggesting the stone was small enough that should pass. We deferred a CT scan at that appointment. However, given the ongoing pain, we elected to perform CT stone study.  Care signed out to Dr. Sasine. On my review of the CT scan, I do not see any obstructing kidney stones. If final rate is negative, could be a punctate stone that is too small to read. She could have passed stone as well. I did refill her oxycodone  for a couple more days given her severe pain but did a short course. Encouraged ongoing over-the-counter medicines. I do not think this is muscle skeletal as patient does not endorse any trauma or injury at the onset. However, if scan is negative and potentially could be.     MEDICAL HISTORY:    CHIEF COMPLAINT:  Flank Pain (/)   HISTORY OF PRESENT ILLNESS:  Denise [name] is a 53 y.o. female who presents to the [facility] ED today for flank plain  HPI:  patient presents for ongoing left flank pain. I saw the patient here in the ER couple of days ago we deferred CT scan at that time. Pain is still severe. She has been taking over-the-counter medicines as well as used all the oxycodone  prescribed last visit. She presents today for further evaluation and refill on pain medication.   SURGICAL HISTORY:  has a past surgical history that includes Renal biopsy; Ovarian cyst removal; Exploratory laparotomy; and uterine ablation.  OUTPATIENT MEDICATIONS: Prior to Admission medications  Medication Sig Start Date End Date Taking? Authorizing Provider  albuterol (PROVENTIL HFA;VENTOLIN HFA) 90  mcg/actuation inhaler Inhale.    [provider]  fluticasone-salmeterol (ADVAIR) 250-50 mcg/dose diskus Inhale 1 puff 2 (two) times a day.  12/04/15   [provider]  gabapentin (NEURONTIN) 100 MG capsule Take 1 capsule (100 mg total) by mouth Three (3) times a day. for 6 days 08/27/17 09/02/17  Riden, Elsie T, DO  metFORMIN (GLUCOPHAGE-XR) 500 MG 24 hr tablet Take 500 mg by mouth Two (2) times a day. 03/23/20   [provider]  ondansetron  (ZOFRAN -ODT) 4 MG disintegrating tablet Take 1 tablet (4 mg total) by mouth every eight (8) hours as needed for nausea for up to 7 days. 07/07/24 07/14/24  Darryll Bernardino Sharper, DO  oxyCODONE  (ROXICODONE ) 5 MG immediate release tablet Take 1 tablet (5 mg total) by mouth every six (6) hours as needed for pain for up to 5 days. 07/07/24 07/12/24  Darryll Bernardino Sharper, DO  tamsulosin (FLOMAX) 0.4 mg capsule Take 1 capsule (0.4 mg total) by mouth daily for 10 days. 07/07/24 07/17/24  Darryll Bernardino Sharper, DO  zolpidem (AMBIEN) 10 mg tablet Take 10 mg by mouth nightly.    [provider]   ALLERGIES: is allergic to amoxicillin, moxifloxacin, and penicillins.  FAMILY HISTORY: family history includes Brain cancer in her paternal grandmother; Breast cancer in her paternal aunt; Dementia in her maternal grandmother; Diabetes in her maternal grandmother; Hypertension in her maternal grandmother; Nephrolithiasis in her father; Prostate cancer in her father; Stroke in her maternal grandmother.  SOCIAL HISTORY: Tobacco use:  reports that she quit smoking  about 5 years ago. Her smoking use included cigarettes. She has been exposed to tobacco smoke. She has never used smokeless tobacco.  Alcohol use:  reports current alcohol use of about 2.0 standard drinks of alcohol per week. Drug use:  reports no history of drug use.  PHYSICAL EXAM   ED Vital Signs: Vitals:   07/09/24 1806  BP: 129/75  Pulse: 96  Resp: 16  Temp: 36.9 C (98.4 F)   TempSrc: Temporal  SpO2: 99%  Weight: 73 kg (161 lb)  Height: 162.6 cm (5' 4)   Physical Exam Vitals reviewed.  Constitutional:      General: She is not in acute distress.    Appearance: Normal appearance. She is not ill-appearing, toxic-appearing or diaphoretic.  HENT:     Head: Normocephalic and atraumatic.     Right Ear: External ear normal.     Left Ear: External ear normal.     Nose: Nose normal.  Pulmonary:     Effort: Pulmonary effort is normal.  Abdominal:     Tenderness: There is no left CVA tenderness.  Skin:    Coloration: Skin is not pale.  Neurological:     Mental Status: She is alert.  Psychiatric:        Judgment: Judgment normal.     LABORATORY DATA   Labs Reviewed - No data to display RADIOLOGY   No results found.  PROCEDURES  None   Pertinent labs & imaging results that were available during my care of the patient were reviewed by me and considered in my medical decision making. Labs and radiology studies included in my note may not constitute all ordered/reviewied labs during this encounter (see chart for details).  __________________________________________  Portions of this record have been created using Scientist, clinical (histocompatibility and immunogenetics). Dictation errors have been sought, but may not have been identified and corrected.    Darryll Bernardino Sharper, DO 07/12/24 1110

## 2024-07-14 ENCOUNTER — Emergency Department
Admission: EM | Admit: 2024-07-14 | Discharge: 2024-07-14 | Disposition: A | Discharge status: Home / Self Care | Attending: Emergency Medicine | Admitting: Emergency Medicine

## 2024-07-14 ENCOUNTER — Encounter: Payer: Self-pay | Admitting: Emergency Medicine

## 2024-07-14 ENCOUNTER — Emergency Department

## 2024-07-14 ENCOUNTER — Other Ambulatory Visit: Payer: Self-pay

## 2024-07-14 DIAGNOSIS — R109 Unspecified abdominal pain: Secondary | ICD-10-CM

## 2024-07-14 DIAGNOSIS — E119 Type 2 diabetes mellitus without complications: Secondary | ICD-10-CM | POA: Insufficient documentation

## 2024-07-14 DIAGNOSIS — J449 Chronic obstructive pulmonary disease, unspecified: Secondary | ICD-10-CM | POA: Diagnosis not present

## 2024-07-14 DIAGNOSIS — R112 Nausea with vomiting, unspecified: Secondary | ICD-10-CM

## 2024-07-14 DIAGNOSIS — R1012 Left upper quadrant pain: Secondary | ICD-10-CM | POA: Diagnosis present

## 2024-07-14 DIAGNOSIS — N390 Urinary tract infection, site not specified: Secondary | ICD-10-CM | POA: Diagnosis not present

## 2024-07-14 LAB — URINALYSIS, ROUTINE W REFLEX MICROSCOPIC
Bilirubin Urine: NEGATIVE
Glucose, UA: NEGATIVE mg/dL
Hgb urine dipstick: NEGATIVE
Ketones, ur: 5 mg/dL — AB
Nitrite: NEGATIVE
Protein, ur: NEGATIVE mg/dL
Specific Gravity, Urine: 1.021 (ref 1.005–1.030)
pH: 5 (ref 5.0–8.0)

## 2024-07-14 LAB — COMPREHENSIVE METABOLIC PANEL WITH GFR
ALT: 18 U/L (ref 0–44)
AST: 18 U/L (ref 15–41)
Albumin: 4.4 g/dL (ref 3.5–5.0)
Alkaline Phosphatase: 76 U/L (ref 38–126)
Anion gap: 14 (ref 5–15)
BUN: 16 mg/dL (ref 6–20)
CO2: 23 mmol/L (ref 22–32)
Calcium: 10.1 mg/dL (ref 8.9–10.3)
Chloride: 105 mmol/L (ref 98–111)
Creatinine, Ser: 0.72 mg/dL (ref 0.44–1.00)
GFR, Estimated: >60 mL/min (ref >60)
Glucose, Bld: 133 mg/dL — ABNORMAL HIGH (ref 70–99)
Potassium: 4.1 mmol/L (ref 3.5–5.1)
Sodium: 142 mmol/L (ref 135–145)
Total Bilirubin: 1.1 mg/dL (ref 0.0–1.2)
Total Protein: 7.7 g/dL (ref 6.5–8.1)

## 2024-07-14 LAB — CBC WITH DIFFERENTIAL/PLATELET
Abs Immature Granulocytes: 0.02 K/uL (ref 0.00–0.07)
Basophils Absolute: 0.1 K/uL (ref 0.0–0.1)
Basophils Relative: 1 %
Eosinophils Absolute: 0.1 K/uL (ref 0.0–0.5)
Eosinophils Relative: 1 %
HCT: 44.3 % (ref 36.0–46.0)
Hemoglobin: 14.6 g/dL (ref 12.0–15.0)
Immature Granulocytes: 0 %
Lymphocytes Relative: 41 %
Lymphs Abs: 3.4 K/uL (ref 0.7–4.0)
MCH: 29.2 pg (ref 26.0–34.0)
MCHC: 33 g/dL (ref 30.0–36.0)
MCV: 88.6 fL (ref 80.0–100.0)
Monocytes Absolute: 0.7 K/uL (ref 0.1–1.0)
Monocytes Relative: 8 %
Neutro Abs: 4.1 K/uL (ref 1.7–7.7)
Neutrophils Relative %: 49 %
Platelets: 595 K/uL — ABNORMAL HIGH (ref 150–400)
RBC: 5 MIL/uL (ref 3.87–5.11)
RDW: 14.6 % (ref 11.5–15.5)
WBC: 8.4 K/uL (ref 4.0–10.5)
nRBC: 0 % (ref 0.0–0.2)

## 2024-07-14 LAB — LIPASE, BLOOD: Lipase: 25 U/L (ref 11–51)

## 2024-07-14 MED ORDER — SODIUM CHLORIDE 0.9 % IV SOLN
1.0000 g | Freq: Once | INTRAVENOUS | Status: AC
  Administered 2024-07-14: 1 g via INTRAVENOUS
  Filled 2024-07-14: qty 10

## 2024-07-14 MED ORDER — MORPHINE SULFATE (PF) 4 MG/ML IV SOLN
4.0000 mg | Freq: Once | INTRAVENOUS | Status: AC
  Administered 2024-07-14: 4 mg via INTRAVENOUS
  Filled 2024-07-14: qty 1

## 2024-07-14 MED ORDER — IOHEXOL 300 MG/ML  SOLN
100.0000 mL | Freq: Once | INTRAMUSCULAR | Status: AC | PRN
  Administered 2024-07-14: 100 mL via INTRAVENOUS

## 2024-07-14 MED ORDER — TRAMADOL HCL 50 MG PO TABS: 50.0000 mg | ORAL_TABLET | Freq: Four times a day (QID) | ORAL | 0 refills | Status: AC | PRN

## 2024-07-14 MED ORDER — ONDANSETRON HCL 4 MG/2ML IJ SOLN
4.0000 mg | Freq: Once | INTRAMUSCULAR | Status: AC
  Administered 2024-07-14: 4 mg via INTRAVENOUS
  Filled 2024-07-14: qty 2

## 2024-07-14 MED ORDER — SODIUM CHLORIDE 0.9 % IV BOLUS
500.0000 mL | Freq: Once | INTRAVENOUS | Status: AC
  Administered 2024-07-14: 500 mL via INTRAVENOUS

## 2024-07-14 MED ORDER — PROMETHAZINE HCL 12.5 MG PO TABS: 12.5000 mg | ORAL_TABLET | Freq: Four times a day (QID) | ORAL | 0 refills | Status: AC | PRN

## 2024-07-14 MED ORDER — CEFDINIR 300 MG PO CAPS: 300.0000 mg | ORAL_CAPSULE | Freq: Two times a day (BID) | ORAL | 0 refills | Status: AC

## 2024-07-14 NOTE — ED Triage Notes (Signed)
 Patient ambulatory to triage with steady gait, without difficulty or distress noted; pt reports left sided abd pain radiating into flank accomp by fever, chills, N/V x 6 days; st hx pancreatitis and kidney stone; ibuprofen and zofran  PTA without relief

## 2024-07-14 NOTE — ED Provider Notes (Addendum)
 [facility] Provider Note    Event Date/Time   First MD Initiated Contact with Patient 07/14/24 [phone]     (approximate)   History   Abdominal Pain   HPI  Denise [name] is a 53 y.o. female with a history of COPD, type 2 diabetes, kidney stones, pancreatitis who presents with left-sided abdominal pain over approximately the last 5 days.  The pain is mainly in her left upper abdomen radiating around to her left flank and mid back.  It is constant.  She reports associated nausea and several episodes of vomiting.  She had 1 loose bowel movement but no significant diarrhea.  She denies any urinary symptoms.  She states that this pain feels completely different than her kidney stone pain and actually is more reminiscent of when she has had pancreatitis.  She states she was evaluated last week for a possible kidney stone and states that she passed a stone 4 days ago.  I reviewed the past medical records.  I confirmed that the patient was seen at the [facility] ED with right-sided lower back and flank pain on 8/21.  Renal ultrasound showed hydronephrosis and the patient was thought to likely have a ureteral stone.  Subsequently she was seen in the [facility] ED on 8/23 with left flank pain.  CT at that time showed no acute abnormality.  She was thought to have either a very small kidney stone or a stone that had already passed.   Physical Exam   Triage Vital Signs: ED Triage Vitals  Encounter Vitals Group     BP 07/14/24 0709 (!) 158/114     Girls Systolic BP Percentile --      Girls Diastolic BP Percentile --      Boys Systolic BP Percentile --      Boys Diastolic BP Percentile --      Pulse Rate 07/14/24 0709 (!) 109     Resp 07/14/24 0709 20     Temp 07/14/24 0709 98.4 F (36.9 C)     Temp Source 07/14/24 0709 Oral     SpO2 07/14/24 0709 100 %     Weight 07/14/24 0657 171 lb (77.6 kg)     Height 07/14/24 0657 5' 2 (1.575 m)     Head Circumference --      Peak Flow --       Pain Score 07/14/24 0657 9     Pain Loc --      Pain Education --      Exclude from Growth Chart --     Most recent vital signs: Vitals:   07/14/24 0721 07/14/24 1008  BP:  121/65  Pulse:  (!) 128  Resp:  18  Temp:  98.3 F (36.8 C)  SpO2: 99% 98%     General: Awake, no distress.  CV:  Good peripheral perfusion.  Resp:  Normal effort.  Abd:  Soft with left upper quadrant and left flank tenderness.  No distention.  Other:  No CVA tenderness.  No jaundice or scleral icterus.   ED Results / Procedures / Treatments   Labs (all labs ordered are listed, but only abnormal results are displayed) Labs Reviewed  CBC WITH DIFFERENTIAL/PLATELET - Abnormal; Notable for the following components:      Result Value   Platelets 595 (*)    All other components within normal limits  COMPREHENSIVE METABOLIC PANEL WITH GFR - Abnormal; Notable for the following components:   Glucose, Bld 133 (*)  All other components within normal limits  URINALYSIS, ROUTINE W REFLEX MICROSCOPIC - Abnormal; Notable for the following components:   Color, Urine YELLOW (*)    APPearance CLEAR (*)    Ketones, ur 5 (*)    Leukocytes,Ua SMALL (*)    Bacteria, UA RARE (*)    All other components within normal limits  LIPASE, BLOOD     EKG     RADIOLOGY    PROCEDURES:  Critical Care performed: No  Procedures   MEDICATIONS ORDERED IN ED: Medications  cefTRIAXone  (ROCEPHIN ) 1 g in sodium chloride  0.9 % 100 mL IVPB (1 g Intravenous New Bag/Given 07/14/24 1238)  sodium chloride  0.9 % bolus 500 mL (500 mLs Intravenous New Bag/Given 07/14/24 0805)  ondansetron  (ZOFRAN ) injection 4 mg (4 mg Intravenous Given 07/14/24 0805)  morphine  (PF) 4 MG/ML injection 4 mg (4 mg Intravenous Given 07/14/24 0805)  ondansetron  (ZOFRAN ) injection 4 mg (4 mg Intravenous Given 07/14/24 1017)  morphine  (PF) 4 MG/ML injection 4 mg (4 mg Intravenous Given 07/14/24 1017)  iohexol  (OMNIPAQUE ) 300 MG/ML solution 100 mL  (100 mLs Intravenous Contrast Given 07/14/24 1102)     IMPRESSION / MDM / ASSESSMENT AND PLAN / ED COURSE  I reviewed the triage vital signs and the nursing notes.  53 year old female with PMH as noted above presents with left-sided abdominal pain associated with nausea and vomiting.  On exam she is tachycardic.  Other vital signs are normal.  She is tender in the left mid/upper abdomen around the left flank.  Differential diagnosis includes, but is not limited to, pancreatitis, gastritis, gastroenteritis, colitis, diverticulitis, bowel obstruction, ureteral stone, pyelonephritis.  We will give fluids, analgesia, obtain lab workup, and reassess.  The patient likely will need imaging since this appears to be a new presentation since her evaluation last week at [facility].  Patient's presentation is most consistent with acute presentation with potential threat to life or bodily function.  The patient is on the cardiac monitor to evaluate for evidence of arrhythmia and/or significant heart rate changes.  ----------------------------------------- 12:42 PM on 07/14/2024 -----------------------------------------  Lab workup is unremarkable.  There is no leukocytosis.  CMP shows no acute findings.  Creatinine is normal.  Lipase is normal.  LFTs are normal.  The urinalysis does show findings concerning for possible UTI including 21-50 WBCs although no nitrites.  CT shows no acute abnormality.  At this time, based on the location of the pain and the abnormal urinalysis findings, I suspect that the most likely etiology, besides possible musculoskeletal pain or chronic abdominal pain would be a UTI, possibly early pyonephritis.  There is no evidence of vascular etiology.  There is no indication for further ED workup or monitoring.  On reassessment the patient is feeling better.  Her heart rate is in the 80s.  Other vital signs remain normal.  She feels comfortable going home.  She is stable for discharge at  this time.  I have ordered a dose of IV ceftriaxone  here and prescribed Omnicef  for possible pyelonephritis.  I have prescribed nausea and pain medication as well.  I counseled the patient on the results of the workup and plan of care.  I gave her strict return precautions, and she expressed understanding.   FINAL CLINICAL IMPRESSION(S) / ED DIAGNOSES   Final diagnoses:  Abdominal pain, unspecified abdominal location  Flank pain  Nausea and vomiting, unspecified vomiting type  Urinary tract infection without hematuria, site unspecified     Rx / DC Orders  ED Discharge Orders          Ordered    promethazine  (PHENERGAN ) 12.5 MG tablet  Every 6 hours PRN        07/14/24 1210    cefdinir  (OMNICEF ) 300 MG capsule  2 times daily        07/14/24 1210    traMADol  (ULTRAM ) 50 MG tablet  Every 6 hours PRN        07/14/24 1210             Note:  This document was prepared using Dragon voice recognition software and may include unintentional dictation errors.      Jacolyn Pae, MD 07/14/24 1245

## 2024-07-14 NOTE — Discharge Instructions (Addendum)
 Take the antibiotic as prescribed and finish the full course.  You may take the Phenergan  as needed for nausea and the tramadol  as needed for pain that is not controlled by over-the-counter medications.  It appears that you likely have a kidney infection.  Your pain should start to improve with the antibiotic over the next few days.  Return to the ER immediately for new, worsening, or persistent severe pain, nausea or vomiting, inability take the medication, weakness, high fever, or any other new or worsening symptoms that concern you.

## 2024-07-14 NOTE — ED Notes (Signed)
 Warm blanket given. NAD, CCB within reach.

## 2024-09-01 ENCOUNTER — Ambulatory Visit: Payer: Self-pay

## 2024-09-01 DIAGNOSIS — Z1211 Encounter for screening for malignant neoplasm of colon: Secondary | ICD-10-CM | POA: Diagnosis present

## 2024-09-01 DIAGNOSIS — K640 First degree hemorrhoids: Secondary | ICD-10-CM | POA: Diagnosis not present

## 2024-09-01 DIAGNOSIS — Z800 Family history of malignant neoplasm of digestive organs: Secondary | ICD-10-CM | POA: Diagnosis not present

## 2024-09-01 DIAGNOSIS — K5730 Diverticulosis of large intestine without perforation or abscess without bleeding: Secondary | ICD-10-CM | POA: Diagnosis not present
